# Patient Record
Sex: Male | Born: 1950 | Race: Black or African American | Hispanic: No | Marital: Single | Smoking: Former smoker
Health system: Southern US, Community
[De-identification: ages and names within clinical notes are randomized; demographics above are authoritative.]

## PROBLEM LIST (undated history)

## (undated) DIAGNOSIS — M51369 Other intervertebral disc degeneration, lumbar region without mention of lumbar back pain or lower extremity pain: Secondary | ICD-10-CM

## (undated) DIAGNOSIS — E785 Hyperlipidemia, unspecified: Secondary | ICD-10-CM

## (undated) DIAGNOSIS — K219 Gastro-esophageal reflux disease without esophagitis: Secondary | ICD-10-CM

## (undated) DIAGNOSIS — I10 Essential (primary) hypertension: Secondary | ICD-10-CM

## (undated) DIAGNOSIS — M5136 Other intervertebral disc degeneration, lumbar region: Secondary | ICD-10-CM

## (undated) DIAGNOSIS — H269 Unspecified cataract: Secondary | ICD-10-CM

## (undated) DIAGNOSIS — T7840XA Allergy, unspecified, initial encounter: Secondary | ICD-10-CM

## (undated) DIAGNOSIS — N400 Enlarged prostate without lower urinary tract symptoms: Secondary | ICD-10-CM

## (undated) DIAGNOSIS — I4891 Unspecified atrial fibrillation: Secondary | ICD-10-CM

## (undated) DIAGNOSIS — M5430 Sciatica, unspecified side: Secondary | ICD-10-CM

## (undated) HISTORY — DX: Benign prostatic hyperplasia without lower urinary tract symptoms: N40.0

## (undated) HISTORY — PX: COLONOSCOPY: SHX174

## (undated) HISTORY — PX: KNEE SURGERY: SHX244

## (undated) HISTORY — DX: Hyperlipidemia, unspecified: E78.5

## (undated) HISTORY — DX: Allergy, unspecified, initial encounter: T78.40XA

## (undated) HISTORY — DX: Gastro-esophageal reflux disease without esophagitis: K21.9

## (undated) HISTORY — DX: Unspecified cataract: H26.9

---

## 2004-10-10 ENCOUNTER — Encounter: Admission: RE | Admit: 2004-10-10 | Discharge: 2004-10-10 | Payer: Self-pay | Admitting: Family Medicine

## 2004-12-16 ENCOUNTER — Encounter: Admission: RE | Admit: 2004-12-16 | Discharge: 2004-12-16 | Payer: Self-pay | Admitting: Family Medicine

## 2006-02-04 ENCOUNTER — Emergency Department (HOSPITAL_COMMUNITY): Admission: EM | Admit: 2006-02-04 | Discharge: 2006-02-04 | Payer: Self-pay | Admitting: Emergency Medicine

## 2007-05-16 ENCOUNTER — Emergency Department (HOSPITAL_COMMUNITY): Admission: EM | Admit: 2007-05-16 | Discharge: 2007-05-16 | Payer: Self-pay | Admitting: Emergency Medicine

## 2007-05-17 ENCOUNTER — Ambulatory Visit (HOSPITAL_COMMUNITY): Admission: RE | Admit: 2007-05-17 | Discharge: 2007-05-17 | Payer: Self-pay | Admitting: Emergency Medicine

## 2007-07-12 ENCOUNTER — Ambulatory Visit: Payer: Self-pay | Admitting: Internal Medicine

## 2007-07-15 ENCOUNTER — Ambulatory Visit: Payer: Self-pay | Admitting: *Deleted

## 2007-08-09 ENCOUNTER — Encounter: Payer: Self-pay | Admitting: Family Medicine

## 2007-08-09 ENCOUNTER — Ambulatory Visit: Payer: Self-pay | Admitting: Internal Medicine

## 2007-08-09 LAB — CONVERTED CEMR LAB
ALT: 28 units/L (ref 0–53)
AST: 26 units/L (ref 0–37)
Albumin: 4.6 g/dL (ref 3.5–5.2)
Alkaline Phosphatase: 77 units/L (ref 39–117)
BUN: 15 mg/dL (ref 6–23)
Basophils Absolute: 0.1 10*3/uL (ref 0.0–0.1)
Basophils Relative: 1 % (ref 0–1)
CO2: 24 meq/L (ref 19–32)
Calcium: 10.2 mg/dL (ref 8.4–10.5)
Chloride: 104 meq/L (ref 96–112)
Cholesterol: 209 mg/dL — ABNORMAL HIGH (ref 0–200)
Creatinine, Ser: 0.78 mg/dL (ref 0.40–1.50)
Eosinophils Absolute: 0.8 10*3/uL — ABNORMAL HIGH (ref 0.0–0.7)
Eosinophils Relative: 9 % — ABNORMAL HIGH (ref 0–5)
Glucose, Bld: 99 mg/dL (ref 70–99)
HCT: 43.2 % (ref 39.0–52.0)
HDL: 82 mg/dL (ref >39)
Hemoglobin: 14.4 g/dL (ref 13.0–17.0)
LDL Cholesterol: 115 mg/dL — ABNORMAL HIGH (ref 0–99)
Lymphocytes Relative: 23 % (ref 12–46)
Lymphs Abs: 2.2 10*3/uL (ref 0.7–4.0)
MCHC: 33.3 g/dL (ref 30.0–36.0)
MCV: 93.1 fL (ref 78.0–100.0)
Monocytes Absolute: 0.9 10*3/uL (ref 0.1–1.0)
Monocytes Relative: 9 % (ref 3–12)
Neutro Abs: 5.6 10*3/uL (ref 1.7–7.7)
Neutrophils Relative %: 59 % (ref 43–77)
PSA: 0.98 ng/mL (ref 0.10–4.00)
Platelets: 236 10*3/uL (ref 150–400)
Potassium: 4.2 meq/L (ref 3.5–5.3)
RBC: 4.64 M/uL (ref 4.22–5.81)
RDW: 15.7 % — ABNORMAL HIGH (ref 11.5–15.5)
Sodium: 140 meq/L (ref 135–145)
TSH: 0.766 microintl units/mL (ref 0.350–4.50)
Total Bilirubin: 0.8 mg/dL (ref 0.3–1.2)
Total CHOL/HDL Ratio: 2.5
Total Protein: 7.2 g/dL (ref 6.0–8.3)
Triglycerides: 62 mg/dL (ref <150)
VLDL: 12 mg/dL (ref 0–40)
WBC: 9.6 10*3/uL (ref 4.0–10.5)

## 2007-08-26 ENCOUNTER — Encounter: Payer: Self-pay | Admitting: Family Medicine

## 2007-08-26 ENCOUNTER — Other Ambulatory Visit: Admission: RE | Admit: 2007-08-26 | Discharge: 2007-08-26 | Payer: Self-pay | Admitting: Family Medicine

## 2007-08-26 ENCOUNTER — Ambulatory Visit: Payer: Self-pay | Admitting: Internal Medicine

## 2007-11-29 ENCOUNTER — Ambulatory Visit: Payer: Self-pay | Admitting: Internal Medicine

## 2009-02-03 ENCOUNTER — Emergency Department (HOSPITAL_COMMUNITY): Admission: EM | Admit: 2009-02-03 | Discharge: 2009-02-03 | Payer: Self-pay | Admitting: Emergency Medicine

## 2009-03-23 ENCOUNTER — Emergency Department (HOSPITAL_COMMUNITY): Admission: EM | Admit: 2009-03-23 | Discharge: 2009-03-23 | Payer: Self-pay | Admitting: Emergency Medicine

## 2009-04-04 ENCOUNTER — Emergency Department (HOSPITAL_COMMUNITY): Admission: EM | Admit: 2009-04-04 | Discharge: 2009-04-04 | Payer: Self-pay | Admitting: Emergency Medicine

## 2009-05-04 ENCOUNTER — Ambulatory Visit (HOSPITAL_COMMUNITY): Admission: RE | Admit: 2009-05-04 | Discharge: 2009-05-04 | Payer: Self-pay | Admitting: General Surgery

## 2009-05-04 HISTORY — PX: INGUINAL HERNIA REPAIR: SUR1180

## 2009-09-23 ENCOUNTER — Emergency Department (HOSPITAL_COMMUNITY): Admission: EM | Admit: 2009-09-23 | Discharge: 2009-09-23 | Payer: Self-pay | Admitting: Emergency Medicine

## 2010-04-17 LAB — DIFFERENTIAL
Basophils Absolute: 0 10*3/uL (ref 0.0–0.1)
Basophils Relative: 1 % (ref 0–1)
Eosinophils Absolute: 0.4 10*3/uL (ref 0.0–0.7)
Eosinophils Relative: 6 % — ABNORMAL HIGH (ref 0–5)
Lymphocytes Relative: 27 % (ref 12–46)
Lymphs Abs: 1.9 10*3/uL (ref 0.7–4.0)
Monocytes Absolute: 0.7 10*3/uL (ref 0.1–1.0)
Monocytes Relative: 11 % (ref 3–12)
Neutro Abs: 3.8 10*3/uL (ref 1.7–7.7)
Neutrophils Relative %: 55 % (ref 43–77)

## 2010-04-17 LAB — CBC
HCT: 36.4 % — ABNORMAL LOW (ref 39.0–52.0)
Hemoglobin: 12.1 g/dL — ABNORMAL LOW (ref 13.0–17.0)
MCHC: 33.3 g/dL (ref 30.0–36.0)
MCV: 93.3 fL (ref 78.0–100.0)
Platelets: 267 10*3/uL (ref 150–400)
RBC: 3.9 MIL/uL — ABNORMAL LOW (ref 4.22–5.81)
RDW: 14.7 % (ref 11.5–15.5)
WBC: 6.8 10*3/uL (ref 4.0–10.5)

## 2010-04-17 LAB — BASIC METABOLIC PANEL
BUN: 11 mg/dL (ref 6–23)
CO2: 28 mEq/L (ref 19–32)
Calcium: 9.4 mg/dL (ref 8.4–10.5)
Chloride: 101 mEq/L (ref 96–112)
Creatinine, Ser: 0.83 mg/dL (ref 0.4–1.5)
GFR calc Af Amer: >60 mL/min (ref >60)
GFR calc non Af Amer: >60 mL/min (ref >60)
Glucose, Bld: 93 mg/dL (ref 70–99)
Potassium: 3.8 mEq/L (ref 3.5–5.1)
Sodium: 136 mEq/L (ref 135–145)

## 2011-08-08 ENCOUNTER — Emergency Department (HOSPITAL_COMMUNITY): Payer: Self-pay

## 2011-08-08 ENCOUNTER — Emergency Department (HOSPITAL_COMMUNITY)
Admission: EM | Admit: 2011-08-08 | Discharge: 2011-08-08 | Disposition: A | Discharge status: Home / Self Care | Payer: Self-pay | Attending: Emergency Medicine | Admitting: Emergency Medicine

## 2011-08-08 ENCOUNTER — Encounter (HOSPITAL_COMMUNITY): Payer: Self-pay

## 2011-08-08 DIAGNOSIS — M545 Low back pain, unspecified: Secondary | ICD-10-CM

## 2011-08-08 DIAGNOSIS — M51379 Other intervertebral disc degeneration, lumbosacral region without mention of lumbar back pain or lower extremity pain: Secondary | ICD-10-CM | POA: Insufficient documentation

## 2011-08-08 DIAGNOSIS — M5137 Other intervertebral disc degeneration, lumbosacral region: Secondary | ICD-10-CM | POA: Insufficient documentation

## 2011-08-08 DIAGNOSIS — I10 Essential (primary) hypertension: Secondary | ICD-10-CM | POA: Insufficient documentation

## 2011-08-08 HISTORY — DX: Essential (primary) hypertension: I10

## 2011-08-08 HISTORY — DX: Other intervertebral disc degeneration, lumbar region without mention of lumbar back pain or lower extremity pain: M51.369

## 2011-08-08 HISTORY — DX: Sciatica, unspecified side: M54.30

## 2011-08-08 HISTORY — DX: Other intervertebral disc degeneration, lumbar region: M51.36

## 2011-08-08 MED ORDER — METHOCARBAMOL 500 MG PO TABS: 1000.0000 mg | ORAL_TABLET | Freq: Four times a day (QID) | ORAL | Status: AC | PRN

## 2011-08-08 MED ORDER — HYDROCODONE-ACETAMINOPHEN 5-325 MG PO TABS
1.0000 | ORAL_TABLET | Freq: Once | ORAL | Status: AC
  Administered 2011-08-08: 1 via ORAL
  Filled 2011-08-08: qty 1

## 2011-08-08 MED ORDER — OXYCODONE-ACETAMINOPHEN 5-325 MG PO TABS: ORAL_TABLET | ORAL | Status: DC

## 2011-08-08 NOTE — ED Notes (Addendum)
Patient with no complaints at this time. Respirations even and unlabored. Skin warm/dry. Discharge instructions reviewed with patient at this time. Patient given opportunity to voice concerns/ask questions.Dr. Richrd Prime notified of elevated BP. Told patient to go home and take BP meds immediately.  Patient discharged at this time and left Emergency Department with steady gait.

## 2011-08-08 NOTE — ED Provider Notes (Signed)
History     CSN: [id number]  Arrival date & time 08/08/11  1623   First MD Initiated Contact with Patient 08/08/11 1937      Chief Complaint  Patient presents with  . Hip Pain     HPI Pt was seen at 1940.  Per pt, c/o gradual onset and persistence of constant acute flair of his chronic left sided low back "pain" which radiates into his left hip and upper leg for the past week.  Pt describes his pain as "achey," and per his usual chronic sciatic pain pattern.  Pain worsens with palpation of the area and body position changes. Denies incont/retention of bowel or bladder, no saddle anesthesia, no focal motor weakness, no tingling/numbness in extremities, no fevers, no rash, no injury, no testicular pain/swelling, no dysuria/hematuria, no abd pain.  The symptoms have been associated with no other complaints. The patient has a significant history of similar symptoms previously.     Past Medical History  Diagnosis Date  . Hypertension   . DDD (degenerative disc disease), lumbar   . Sciatica     Past Surgical History  Procedure Date  . Inguinal hernia repair     History  Substance Use Topics  . Smoking status: Never Smoker   . Smokeless tobacco: Not on file  . Alcohol Use:      beer occ    Review of Systems ROS: Statement: All systems negative except as marked or noted in the HPI; Constitutional: Negative for fever and chills. ; ; Eyes: Negative for eye pain, redness and discharge. ; ; ENMT: Negative for ear pain, hoarseness, nasal congestion, sinus pressure and sore throat. ; ; Cardiovascular: Negative for chest pain, palpitations, diaphoresis, dyspnea and peripheral edema. ; ; Respiratory: Negative for cough, wheezing and stridor. ; ; Gastrointestinal: Negative for nausea, vomiting, diarrhea, abdominal pain, blood in stool, hematemesis, jaundice and rectal bleeding. . ; ; Genitourinary: Negative for dysuria, flank pain and hematuria. ; ; Musculoskeletal: +LBP, hip pain. Negative for  neck pain. Negative for swelling and trauma.; ; Skin: Negative for pruritus, rash, abrasions, blisters, bruising and skin lesion.; ; Neuro: Negative for headache, lightheadedness and neck stiffness. Negative for weakness, altered level of consciousness , altered mental status, extremity weakness, paresthesias, involuntary movement, seizure and syncope.     Allergies  Review of patient's allergies indicates no known allergies.  Home Medications   Current Outpatient Rx  Name Route Sig Dispense Refill  . CLONIDINE HCL 0.1 MG PO TABS Oral Take 0.3 mg by mouth daily.      BP 193/105  Pulse 68  Temp 98.3 F (36.8 C) (Oral)  Resp 20  Ht 6\' 3"  (1.905 m)  Wt 200 lb (90.719 kg)  BMI 25.00 kg/m2  SpO2 98%  Physical Exam 1945: Physical examination:  Nursing notes reviewed; Vital signs and O2 SAT reviewed;  Constitutional: Well developed, Well nourished, Well hydrated, In no acute distress; Head:  Normocephalic, atraumatic; Eyes: EOMI, PERRL, No scleral icterus; ENMT: Mouth and pharynx normal, Mucous membranes moist; Neck: Supple, Full range of motion, No lymphadenopathy; Cardiovascular: Regular rate and rhythm, No gallop; Respiratory: Breath sounds clear & equal bilaterally, No rales, rhonchi, wheezes.  Speaking full sentences with ease, Normal respiratory effort/excursion; Chest: Nontender, Movement normal; Abdomen: Soft, Nontender, Nondistended, Normal bowel sounds; Genitourinary: No CVA tenderness; Spine:  No midline CS, TS, LS tenderness. +TTP left lower lumbar paraspinal muscles and SI joint areas.;; Extremities: Pelvis stable. Pulses normal, No tenderness, No edema, No calf edema  or asymmetry.; Neuro: AA&Ox3, Major CN grossly intact.  Speech clear. Strength 5/5 equal bilat UE's and LE's, including great toe dorsiflexion.  DTR 2/4 equal bilat UE's and LE's.  No gross sensory deficits.  Neg straight leg raises bilat.; Skin: Color normal, Warm, Dry.   ED Course  Procedures    MDM   MDM Reviewed: nursing note and vitals Interpretation: x-ray    Dg Lumbar Spine Complete 08/08/2011  *RADIOLOGY REPORT*  Clinical Data: Left-sided low back pain.  LUMBAR SPINE - COMPLETE 4+ VIEW  Comparison: None.  Findings: No evidence of lumbar spine fracture, spondylolysis, or spondylolisthesis.  Mild degenerative disc disease is seen at L2-3 and L3-4.  Moderate degenerative disc disease is seen at L4-5 and L5-S1.  Mild bilateral facet DJD is also seen in the lower lumbar spine from levels of L3-S1.  No other bone lesions identified.  IMPRESSION:  1.  No acute findings. 2.  Lower lumbar degenerative spondylosis.  Original Report Authenticated By: Danae Orleans, M.D.   Dg Hip Complete Left 08/08/2011  *RADIOLOGY REPORT*  Clinical Data: Left hip pain for 1 week, no injury  LEFT HIP - COMPLETE 2+ VIEW  Comparison: None.  Findings: There is mild degenerative joint disease of the hips with some loss of joint space and spurring.  The pelvic rami are intact. The SI joints are corticated.  No acute abnormality is seen.  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  No acute abnormality.  Original Report Authenticated By: Juline Patch, M.D.      8:44 PM:  DJD/DDD on XR.  No concerning findings for cauda equina at this time.  Abd benign with strong pedal pulses, no hx of aortic pathology on CT AP from 2006; doubt AAA/PAD as cause for pain at this time.  Feels better and wants to go home now.  BP is elevated today, has hx of HTN, currently asymptomatic.  Pt states he has his BP meds at home, but "just forgot to take them" today (wife states "it's been a few days").  Both are very sure pt has his BP meds at home and would rather go home and take them (vs being given a dose here).  Both state pt does not need a rx for them.  Dx testing d/w pt and family.  Questions answered.  Verb understanding, agreeable to d/c home with outpt f/u and strongly encouraged to take his BP meds when he gets home tonight  and to take them regularly. Verb understanding and agreeable.         Laray Anger, DO 08/09/11 [phone]

## 2011-08-08 NOTE — ED Notes (Signed)
Pt reports approx one week ago woke up with slight pain in left hip radiating down leg.  Reports pain has gotten worse over the week.  Says has had pain like this before but says doesn't remember what the doctor told him was the cause.

## 2012-06-17 ENCOUNTER — Encounter (HOSPITAL_COMMUNITY): Payer: Self-pay | Admitting: Emergency Medicine

## 2012-06-17 ENCOUNTER — Emergency Department (HOSPITAL_COMMUNITY)
Admission: EM | Admit: 2012-06-17 | Discharge: 2012-06-17 | Disposition: A | Discharge status: Home / Self Care | Payer: BC Managed Care – PPO | Attending: Emergency Medicine | Admitting: Emergency Medicine

## 2012-06-17 DIAGNOSIS — Z79899 Other long term (current) drug therapy: Secondary | ICD-10-CM | POA: Insufficient documentation

## 2012-06-17 DIAGNOSIS — M51379 Other intervertebral disc degeneration, lumbosacral region without mention of lumbar back pain or lower extremity pain: Secondary | ICD-10-CM | POA: Insufficient documentation

## 2012-06-17 DIAGNOSIS — M5137 Other intervertebral disc degeneration, lumbosacral region: Secondary | ICD-10-CM | POA: Insufficient documentation

## 2012-06-17 DIAGNOSIS — L03116 Cellulitis of left lower limb: Secondary | ICD-10-CM

## 2012-06-17 DIAGNOSIS — L02619 Cutaneous abscess of unspecified foot: Secondary | ICD-10-CM | POA: Insufficient documentation

## 2012-06-17 DIAGNOSIS — I10 Essential (primary) hypertension: Secondary | ICD-10-CM | POA: Insufficient documentation

## 2012-06-17 DIAGNOSIS — Z8739 Personal history of other diseases of the musculoskeletal system and connective tissue: Secondary | ICD-10-CM | POA: Insufficient documentation

## 2012-06-17 MED ORDER — CEPHALEXIN 250 MG PO CAPS: 250.0000 mg | ORAL_CAPSULE | Freq: Four times a day (QID) | ORAL | Status: DC

## 2012-06-17 MED ORDER — SULFAMETHOXAZOLE-TRIMETHOPRIM 800-160 MG PO TABS: 1.0000 | ORAL_TABLET | Freq: Two times a day (BID) | ORAL | Status: DC

## 2012-06-17 NOTE — ED Provider Notes (Signed)
History     CSN: [id number]  Arrival date & time 06/17/12  1147   First MD Initiated Contact with Patient 06/17/12 1208      Chief Complaint  Patient presents with  . Insect Bite    (Consider location/radiation/quality/duration/timing/severity/associated sxs/prior treatment) HPI Trevor Rangel is a 62 y.o. male who presents to the ED with redness of his left foot due to  possible insect bite. The area started itching about a week ago and then the past 3 days he noted redness starting. The redness has seem to spread but the swelling has decreased. The pain is moderate.  He denies fever or chills. The history was provided by the patient.   Past Medical History  Diagnosis Date  . Hypertension   . DDD (degenerative disc disease), lumbar   . Sciatica     Past Surgical History  Procedure Laterality Date  . Inguinal hernia repair      No family history on file.  History  Substance Use Topics  . Smoking status: Never Smoker   . Smokeless tobacco: Not on file  . Alcohol Use:      Comment: beer occ      Review of Systems  Constitutional: Negative for fever and chills.  HENT: Negative for neck pain.   Respiratory: Negative for cough and chest tightness.   Gastrointestinal: Negative for nausea, vomiting and abdominal pain.  Musculoskeletal:       Redness and ? Insect bit to left foot.  Skin: Positive for wound.  Neurological: Negative for headaches.  Psychiatric/Behavioral: The patient is not nervous/anxious.     Allergies  Review of patient's allergies indicates no known allergies.  Home Medications   Current Outpatient Rx  Name  Route  Sig  Dispense  Refill  . cloNIDine (CATAPRES) 0.1 MG tablet   Oral   Take 0.3 mg by mouth daily.         Marland Kitchen terazosin (HYTRIN) 5 MG capsule   Oral   Take 5 mg by mouth at bedtime.           BP 143/86  Pulse 83  Temp(Src) 97.5 F (36.4 C) (Oral)  Resp 20  Ht 6\' 3"  (1.905 m)  Wt 205 lb (92.987 kg)  BMI 25.62 kg/m2   SpO2 100%  Physical Exam  Nursing note and vitals reviewed. Constitutional: He is oriented to person, place, and time. He appears well-developed and well-nourished. No distress.  HENT:  Head: Normocephalic.  Eyes: EOM are normal.  Neck: Neck supple.  Cardiovascular: Normal rate.   Pulmonary/Chest: Effort normal.  Musculoskeletal:       Left foot: He exhibits normal range of motion, no tenderness, normal capillary refill, no deformity and no laceration. Swelling: minimal.       Feet:  Left lateral foot with erythema. Pedal pulse strong, adequate circulation, good touch sensation.  Neurological: He is alert and oriented to person, place, and time. He has normal strength. No cranial nerve deficit or sensory deficit.  Skin: Skin is warm and dry.  Psychiatric: He has a normal mood and affect. His behavior is normal. Judgment and thought content normal.    ED Course  Procedures (including critical care time)  MDM  62 y.o. male with cellulitis to the left foot resulting from possible insect bite. I have reviewed this patient's vital signs, nurses notes and discussed clinical findings and plan of care with the patient. Patient voices understanding. No further screening or x-ray indicated at this time. Will  start antibiotics and take Benadryl as needed.  I discussed this case with Dr. Bernette Mayers   Medication List    TAKE these medications       cephALEXin 250 MG capsule  Commonly known as:  KEFLEX  Take 1 capsule (250 mg total) by mouth 4 (four) times daily.     sulfamethoxazole-trimethoprim 800-160 MG per tablet  Commonly known as:  SEPTRA DS  Take 1 tablet by mouth every 12 (twelve) hours.      ASK your doctor about these medications       cloNIDine 0.1 MG tablet  Commonly known as:  CATAPRES  Take 0.3 mg by mouth daily.     terazosin 5 MG capsule  Commonly known as:  HYTRIN  Take 5 mg by mouth at bedtime.               [place], [place] 06/17/12 [phone]

## 2012-06-17 NOTE — ED Notes (Signed)
States that he noticed a red area on left foot about 2-3 days ago.  States the area was itching about 1 week.

## 2012-06-18 NOTE — ED Provider Notes (Signed)
Medical screening examination/treatment/procedure(s) were performed by non-physician practitioner and as supervising physician I was immediately available for consultation/collaboration.   Charles B. Sheldon, MD 06/18/12 1559 

## 2012-12-15 ENCOUNTER — Ambulatory Visit (INDEPENDENT_AMBULATORY_CARE_PROVIDER_SITE_OTHER): Payer: BC Managed Care – PPO | Admitting: Surgery

## 2012-12-15 ENCOUNTER — Telehealth (INDEPENDENT_AMBULATORY_CARE_PROVIDER_SITE_OTHER): Payer: Self-pay | Admitting: Surgery

## 2012-12-15 ENCOUNTER — Encounter (INDEPENDENT_AMBULATORY_CARE_PROVIDER_SITE_OTHER): Payer: Self-pay | Admitting: Surgery

## 2012-12-15 VITALS — BP 162/78 | HR 76 | Resp 16 | Ht 75.0 in | Wt 202.6 lb

## 2012-12-15 DIAGNOSIS — K4020 Bilateral inguinal hernia, without obstruction or gangrene, not specified as recurrent: Secondary | ICD-10-CM | POA: Insufficient documentation

## 2012-12-15 DIAGNOSIS — K4090 Unilateral inguinal hernia, without obstruction or gangrene, not specified as recurrent: Secondary | ICD-10-CM

## 2012-12-15 DIAGNOSIS — N400 Enlarged prostate without lower urinary tract symptoms: Secondary | ICD-10-CM

## 2012-12-15 NOTE — Progress Notes (Addendum)
Subjective:     Patient ID: Trevor Rangel, male   DOB: 09-Feb-1950, 62 y.o.   MRN: [id number]  HPI  Trevor Rangel  06-04-1950 [id number]  Patient Care Team: Reggie Pile. Dareen Piano as PCP - General (Nurse Practitioner) Reggie Pile. Dareen Piano as Publishing rights manager (Nurse Practitioner)  This patient is a 63 y.o.male who presents today for surgical evaluation at the request of Dr. Dayton Scrape, NP.   Reason for visit: Right inguinal hernia  Was an active male.  History of left inguinal hernia requiring open repair by Dr. Zachery Dakins 2011.  Patient noted some groin swelling on the opposite, right side.  It is gradually increased.  It is more sensitive.  He does struggle with some mild urinary BPH issues but that is stable on the Hytrin.  Normally has a daily bowel movement.  He walks several miles a day without difficulty.  Has a bowel movement every day.  No history of skin infections or MRSA  Patient Active Problem List   Diagnosis Date Noted  . Inguinal hernia, right 12/15/2012  . BPH (benign prostatic hyperplasia)     Past Medical History  Diagnosis Date  . Hypertension   . DDD (degenerative disc disease), lumbar   . Sciatica   . BPH (benign prostatic hyperplasia)     Past Surgical History  Procedure Laterality Date  . Inguinal hernia repair Left 05/04/2009    Dr Zachery Dakins    History   Social History  . Marital Status: Single    Spouse Name: N/A    Number of Children: N/A  . Years of Education: N/A   Occupational History  . Not on file.   Social History Main Topics  . Smoking status: Former Games developer  . Smokeless tobacco: Never Used  . Alcohol Use: Yes     Comment: beer occ  . Drug Use: No  . Sexual Activity: Not on file   Other Topics Concern  . Not on file   Social History Narrative  . No narrative on file    Family History  Problem Relation Age of Onset  . Heart disease Mother   . Cancer Father     prostate    Current Outpatient Prescriptions  Medication Sig  Dispense Refill  . thiamine 50 MG tablet Take 50 mg by mouth daily.      . cloNIDine (CATAPRES) 0.1 MG tablet Take 0.3 mg by mouth daily.       No current facility-administered medications for this visit.     No Known Allergies  BP 162/78  Pulse 76  Resp 16  Ht 6\' 3"  (1.905 m)  Wt 202 lb 9.6 oz (91.899 kg)  BMI 25.32 kg/m2  No results found.   Review of Systems  Constitutional: Negative for fever, chills and diaphoresis.  HENT: Negative for ear discharge, facial swelling, mouth sores, nosebleeds, sore throat and trouble swallowing.   Eyes: Negative for photophobia, discharge and visual disturbance.  Respiratory: Negative for choking, chest tightness, shortness of breath and stridor.   Cardiovascular: Negative for chest pain and palpitations.  Gastrointestinal: Negative for nausea, vomiting, abdominal pain, diarrhea, constipation, blood in stool, abdominal distention, anal bleeding and rectal pain.  Endocrine: Negative for cold intolerance and heat intolerance.  Genitourinary: Positive for difficulty urinating. Negative for dysuria, urgency, frequency, flank pain, discharge, penile swelling, scrotal swelling, enuresis, penile pain and testicular pain.       Mild difficulty starting urinary stream.  No nighttime urination.  Musculoskeletal: Negative for arthralgias, back  pain, gait problem and myalgias.  Skin: Negative for color change, pallor, rash and wound.  Allergic/Immunologic: Negative for environmental allergies and food allergies.  Neurological: Negative for dizziness, speech difficulty, weakness, numbness and headaches.  Hematological: Negative for adenopathy. Does not bruise/bleed easily.  Psychiatric/Behavioral: Negative for hallucinations, confusion and agitation.       Objective:   Physical Exam  Constitutional: He is oriented to person, place, and time. He appears well-developed and well-nourished. No distress.  HENT:  Head: Normocephalic.  Mouth/Throat:  Oropharynx is clear and moist. No oropharyngeal exudate.  Eyes: Conjunctivae and EOM are normal. Pupils are equal, round, and reactive to light. No scleral icterus.  Neck: Normal range of motion. Neck supple. No tracheal deviation present.  Cardiovascular: Normal rate, regular rhythm and intact distal pulses.   Pulmonary/Chest: Effort normal and breath sounds normal. No respiratory distress.  Abdominal: Soft. He exhibits no distension. There is no tenderness. A hernia is present. Hernia confirmed positive in the right inguinal area. Hernia confirmed negative in the ventral area.    Musculoskeletal: Normal range of motion. He exhibits no tenderness.  Lymphadenopathy:    He has no cervical adenopathy.       Right: No inguinal adenopathy present.       Left: No inguinal adenopathy present.  Neurological: He is alert and oriented to person, place, and time. No cranial nerve deficit. He exhibits normal muscle tone. Coordination normal.  Skin: Skin is warm and dry. No rash noted. He is not diaphoretic. No erythema. No pallor.  Psychiatric: He has a normal mood and affect. His behavior is normal. Judgment and thought content normal.       Assessment:     Obvious right inguinal hernia.  Possible small recurrent left inguinal hernia although unlikely.      Plan:     I recommended surgical repair of the right groin.  He wished me to check the left side as well.  He would be a good laparoscopic candidate.  He is hoping to get back to work within a week.  Cautioned him that may be too soon and then 2 weeks is more realistic but we will see.  I discussed with him:   The anatomy & physiology of the abdominal wall and pelvic floor was discussed.  The pathophysiology of hernias in the inguinal and pelvic region was discussed.  Natural history risks such as progressive enlargement, pain, incarceration & strangulation was discussed.   Contributors to complications such as smoking, obesity, diabetes,  prior surgery, etc were discussed.    I feel the risks of no intervention will lead to serious problems that outweigh the operative risks; therefore, I recommended surgery to reduce and repair the hernia.  I explained laparoscopic techniques with possible need for an open approach.  I noted usual use of mesh to patch and/or buttress hernia repair  Risks such as bleeding, infection, abscess, need for further treatment, heart attack, death, and other risks were discussed.  I noted a good likelihood this will help address the problem.   Goals of post-operative recovery were discussed as well.  Possibility that this will not correct all symptoms was explained.  I stressed the importance of low-impact activity, aggressive pain control, avoiding constipation, & not pushing through pain to minimize risk of post-operative chronic pain or injury. Possibility of reherniation was discussed.  We will work to minimize complications.     An educational handout further explaining the pathology & treatment options was given as well.  Questions were answered.  The patient expresses understanding & wishes to proceed with surgery.  Consider followup colonoscopy since it is most likely been 10 years.  I defer to his primary care physician.

## 2012-12-15 NOTE — Telephone Encounter (Signed)
I met with patient and went over his benefits and financial responsibility. He will call me back when he is ready to schedule his surgery. skm

## 2012-12-15 NOTE — Patient Instructions (Signed)
See the Handout(s) we gave you.  Consider surgery.  Please call our office at [phone] if you wish to schedule surgery or if you have further questions / concerns.   Hernia A hernia occurs when an internal organ pushes out through a weak spot in the abdominal wall. Hernias most commonly occur in the groin and around the navel. Hernias often can be pushed back into place (reduced). Most hernias tend to get worse over time. Some abdominal hernias can get stuck in the opening (irreducible or incarcerated hernia) and cannot be reduced. An irreducible abdominal hernia which is tightly squeezed into the opening is at risk for impaired blood supply (strangulated hernia). A strangulated hernia is a medical emergency. Because of the risk for an irreducible or strangulated hernia, surgery may be recommended to repair a hernia. CAUSES   Heavy lifting.  Prolonged coughing.  Straining to have a bowel movement.  A cut (incision) made during an abdominal surgery. HOME CARE INSTRUCTIONS   Bed rest is not required. You may continue your normal activities.  Avoid lifting more than 10 pounds (4.5 kg) or straining.  Cough gently. If you are a smoker it is best to stop. Even the best hernia repair can break down with the continual strain of coughing. Even if you do not have your hernia repaired, a cough will continue to aggravate the problem.  Do not wear anything tight over your hernia. Do not try to keep it in with an outside bandage or truss. These can damage abdominal contents if they are trapped within the hernia sac.  Eat a normal diet.  Avoid constipation. Straining over long periods of time will increase hernia size and encourage breakdown of repairs. If you cannot do this with diet alone, stool softeners may be used. SEEK IMMEDIATE MEDICAL CARE IF:   You have a fever.  You develop increasing abdominal pain.  You feel nauseous or vomit.  Your hernia is stuck outside the abdomen, looks  discolored, feels hard, or is tender.  You have any changes in your bowel habits or in the hernia that are unusual for you.  You have increased pain or swelling around the hernia.  You cannot push the hernia back in place by applying gentle pressure while lying down. MAKE SURE YOU:   Understand these instructions.  Will watch your condition.  Will get help right away if you are not doing well or get worse. Document Released: 01/13/2005 Document Revised: 04/07/2011 Document Reviewed: 09/02/2007 [facility] Patient Information 2014 [place].  HERNIA REPAIR: POST OP INSTRUCTIONS  1. DIET: Follow a light bland diet the first 24 hours after arrival home, such as soup, liquids, crackers, etc.  Be sure to include lots of fluids daily.  Avoid fast food or heavy meals as your are more likely to get nauseated.  Eat a low fat the next few days after surgery. 2. Take your usually prescribed home medications unless otherwise directed. 3. PAIN CONTROL: a. Pain is best controlled by a usual combination of three different methods TOGETHER: i. Ice/Heat ii. Over the counter pain medication iii. Prescription pain medication b. Most patients will experience some swelling and bruising around the hernia(s) such as the bellybutton, groins, or old incisions.  Ice packs or heating pads (30-60 minutes up to 6 times a day) will help. Use ice for the first few days to help decrease swelling and bruising, then switch to heat to help relax tight/sore spots and speed recovery.  Some people prefer to  use ice alone, heat alone, alternating between ice & heat.  Experiment to what works for you.  Swelling and bruising can take several weeks to resolve.   c. It is helpful to take an over-the-counter pain medication regularly for the first few weeks.  Choose one of the following that works best for you: i. Naproxen (Aleve, etc)  Two 220mg tabs twice a day ii. Ibuprofen (Advil, etc) Three 200mg tabs four times a day  (every meal & bedtime) iii. Acetaminophen (Tylenol, etc) 325-650mg four times a day (every meal & bedtime) d. A  prescription for pain medication should be given to you upon discharge.  Take your pain medication as prescribed.  i. If you are having problems/concerns with the prescription medicine (does not control pain, nausea, vomiting, rash, itching, etc), please call us [phone] to see if we need to switch you to a different pain medicine that will work better for you and/or control your side effect better. ii. If you need a refill on your pain medication, please contact your pharmacy.  They will contact our office to request authorization. Prescriptions will not be filled after 5 pm or on week-ends. 4. Avoid getting constipated.  Between the surgery and the pain medications, it is common to experience some constipation.  Increasing fluid intake and taking a fiber supplement (such as Metamucil, Citrucel, FiberCon, MiraLax, etc) 1-2 times a day regularly will usually help prevent this problem from occurring.  A mild laxative (prune juice, Milk of Magnesia, MiraLax, etc) should be taken according to package directions if there are no bowel movements after 48 hours.   5. Wash / shower every day.  You may shower over the dressings as they are waterproof.   6. Remove your waterproof bandages 5 days after surgery.  You may leave the incision open to air.  You may replace a dressing/Band-Aid to cover the incision for comfort if you wish.  Continue to shower over incision(s) after the dressing is off.    7. ACTIVITIES as tolerated:   a. You may resume regular (light) daily activities beginning the next day-such as daily self-care, walking, climbing stairs-gradually increasing activities as tolerated.  If you can walk 30 minutes without difficulty, it is safe to try more intense activity such as jogging, treadmill, bicycling, low-impact aerobics, swimming, etc. b. Save the most intensive and strenuous  activity for last such as sit-ups, heavy lifting, contact sports, etc  Refrain from any heavy lifting or straining until you are off narcotics for pain control.   c. DO NOT PUSH THROUGH PAIN.  Let pain be your guide: If it hurts to do something, don't do it.  Pain is your body warning you to avoid that activity for another week until the pain goes down. d. You may drive when you are no longer taking prescription pain medication, you can comfortably wear a seatbelt, and you can safely maneuver your car and apply brakes. e. You may have sexual intercourse when it is comfortable.  8. FOLLOW UP in our office a. Please call [facility] at [phone] to set up an appointment to see your surgeon in the office for a follow-up appointment approximately 2-3 weeks after your surgery. b. Make sure that you call for this appointment the day you arrive home to insure a convenient appointment time. 9.  IF YOU HAVE DISABILITY OR FAMILY LEAVE FORMS, BRING THEM TO THE OFFICE FOR PROCESSING.  DO NOT GIVE THEM TO YOUR DOCTOR.  WHEN TO CALL US (  [phone]: 1. Poor pain control 2. Reactions / problems with new medications (rash/itching, nausea, etc)  3. Fever over 101.5 F (38.5 C) 4. Inability to urinate 5. Nausea and/or vomiting 6. Worsening swelling or bruising 7. Continued bleeding from incision. 8. Increased pain, redness, or drainage from the incision   The clinic staff is available to answer your questions during regular business hours (8:30am-5pm).  Please don't hesitate to call and ask to speak to one of our nurses for clinical concerns.   If you have a medical emergency, go to the nearest emergency room or call 911.  A surgeon from [facility] is always on call at the hospitals in [facility], [place] [address], [place], [place]  [address] ?  [address] [address], [place], [place]   [address] MAIN: [phone] ? TOLL FREE: [phone] ? FAX: [phone]  [phone] [email or web address]  Benign Prostatic Hyperplasia An enlarged prostate (benign prostatic hyperplasia) is common in older men. You may experience the following:  Weak urine stream.  Dribbling.  Feeling like the bladder has not emptied completely.  Difficulty starting urination.  Getting up frequently at night to urinate.  Urinating more frequently during the day. HOME CARE INSTRUCTIONS  Monitor your prostatic hyperplasia for any changes. The following actions may help to alleviate any discomfort you are experiencing:  Give yourself time when you urinate.  Stay away from alcohol.  Avoid beverages containing caffeine, such as coffee, tea, and colas, because they can make the problem worse.  Avoid decongestants, antihistamines, and some prescription medicines that can make the problem worse.  Follow up with your health care provider for further treatment as recommended. SEEK MEDICAL CARE IF:  You are experiencing progressive difficulty voiding.  Your urine stream is progressively getting narrower.  You are awaking from sleep with the urge to void more frequently.  You are constantly feeling the need to void.  You experience loss of urine, especially in small amounts. SEEK IMMEDIATE MEDICAL CARE IF:   You develop increased pain with urination or are unable to urinate.  You develop severe abdominal pain, vomiting, a high fever, or fainting.  You develop back pain or blood in your urine. MAKE SURE YOU:   Understand these instructions.  Will watch your condition.  Will get help right away if you are not doing well or get worse. Document Released: 01/13/2005 Document Revised: 09/15/2012 Document Reviewed: 06/15/2012 [facility] Patient Information 2014 [place], [place].

## 2013-03-10 ENCOUNTER — Encounter (HOSPITAL_COMMUNITY): Payer: Self-pay | Admitting: Pharmacy Technician

## 2013-03-10 ENCOUNTER — Encounter (HOSPITAL_COMMUNITY)
Admission: RE | Admit: 2013-03-10 | Discharge: 2013-03-10 | Disposition: A | Discharge status: Home / Self Care | Payer: BC Managed Care – PPO | Source: Ambulatory Visit | Attending: Surgery | Admitting: Surgery

## 2013-03-10 ENCOUNTER — Ambulatory Visit (HOSPITAL_COMMUNITY)
Admission: RE | Admit: 2013-03-10 | Discharge: 2013-03-10 | Disposition: A | Discharge status: Home / Self Care | Payer: BC Managed Care – PPO | Source: Ambulatory Visit | Attending: Surgery | Admitting: Surgery

## 2013-03-10 ENCOUNTER — Encounter (HOSPITAL_COMMUNITY): Payer: Self-pay

## 2013-03-10 DIAGNOSIS — K469 Unspecified abdominal hernia without obstruction or gangrene: Secondary | ICD-10-CM | POA: Insufficient documentation

## 2013-03-10 DIAGNOSIS — I10 Essential (primary) hypertension: Secondary | ICD-10-CM | POA: Insufficient documentation

## 2013-03-10 DIAGNOSIS — R9431 Abnormal electrocardiogram [ECG] [EKG]: Secondary | ICD-10-CM | POA: Insufficient documentation

## 2013-03-10 DIAGNOSIS — Z01812 Encounter for preprocedural laboratory examination: Secondary | ICD-10-CM | POA: Insufficient documentation

## 2013-03-10 DIAGNOSIS — Z01818 Encounter for other preprocedural examination: Secondary | ICD-10-CM | POA: Insufficient documentation

## 2013-03-10 DIAGNOSIS — Z01810 Encounter for preprocedural cardiovascular examination: Secondary | ICD-10-CM | POA: Insufficient documentation

## 2013-03-10 LAB — BASIC METABOLIC PANEL
BUN: 20 mg/dL (ref 6–23)
CHLORIDE: 98 meq/L (ref 96–112)
CO2: 25 mEq/L (ref 19–32)
Calcium: 10.3 mg/dL (ref 8.4–10.5)
Creatinine, Ser: 0.86 mg/dL (ref 0.50–1.35)
GFR calc Af Amer: >90 mL/min (ref >90)
GFR calc non Af Amer: >90 mL/min (ref >90)
Glucose, Bld: 104 mg/dL — ABNORMAL HIGH (ref 70–99)
Potassium: 4 mEq/L (ref 3.7–5.3)
Sodium: 138 mEq/L (ref 137–147)

## 2013-03-10 LAB — CBC
HCT: 40.9 % (ref 39.0–52.0)
Hemoglobin: 13.9 g/dL (ref 13.0–17.0)
MCH: 30 pg (ref 26.0–34.0)
MCHC: 34 g/dL (ref 30.0–36.0)
MCV: 88.3 fL (ref 78.0–100.0)
PLATELETS: 258 10*3/uL (ref 150–400)
RBC: 4.63 MIL/uL (ref 4.22–5.81)
RDW: 14.7 % (ref 11.5–15.5)
WBC: 6 10*3/uL (ref 4.0–10.5)

## 2013-03-10 NOTE — Patient Instructions (Addendum)
20 Trevor Rangel  03/10/2013   Your procedure is scheduled on:  2-19 -2015  Report to [facility] at      [place]  AM .  Call this number if you have problems the morning of surgery: [phone]  Or Presurgical Testing [phone](Emmaus Brandi) For Living Will and/or Health Care Power Attorney Forms: please provide copy for your medical record,may bring AM of surgery(Forms should be already notarized -we do not provide this service).  Remember: Follow any bowel prep instructions per MD office. For Cpap use: Bring mask and tubing only.   Do not eat food:After Midnight.    Take these medicines the morning of surgery with A SIP OF WATER: none.    Do not wear jewelry, make-up or nail polish.  Do not wear lotions, powders, or perfumes. You may wear deodorant.  Do not shave 48 hours(2 days) prior to first CHG shower(legs and under arms).(Shaving face and neck okay.)  Do not bring valuables to the hospital.(Hospital is not responsible for lost valuables).  Contacts, dentures or removable bridgework, body piercing, hair pins may not be worn into surgery.  Leave suitcase in the car. After surgery it may be brought to your room.  For patients admitted to the hospital, checkout time is 11:00 AM the day of discharge.(Restricted visitors-Any Persons displaying flu-like symptoms or illness).    Patients discharged the day of surgery will not be allowed to drive home. Must have responsible person with you x 24 hours once discharged.  Name and phone number of your driver: Kerry Dory -girlfriend [phone]  Special Instructions: CHG(Chlorhedine 4%-"Hibiclens","Betasept","Aplicare") Shower Use Special Wash: see special instructions.(avoid face and genitals)   Please read over the following fact sheets that you were given: MRSA Information, Blood Transfusion fact sheet, Incentive Spirometry Instruction.    Failure to follow these instructions may result in Cancellation of your  surgery.   Patient signature_______________________________________________________

## 2013-03-10 NOTE — Pre-Procedure Instructions (Signed)
03-10-13 EKG/ CXR done today.

## 2013-03-13 ENCOUNTER — Emergency Department (HOSPITAL_COMMUNITY)
Admission: EM | Admit: 2013-03-13 | Discharge: 2013-03-13 | Disposition: A | Discharge status: Home / Self Care | Payer: BC Managed Care – PPO | Attending: Emergency Medicine | Admitting: Emergency Medicine

## 2013-03-13 ENCOUNTER — Encounter (HOSPITAL_COMMUNITY): Payer: Self-pay | Admitting: Emergency Medicine

## 2013-03-13 DIAGNOSIS — Z87448 Personal history of other diseases of urinary system: Secondary | ICD-10-CM | POA: Insufficient documentation

## 2013-03-13 DIAGNOSIS — Z79899 Other long term (current) drug therapy: Secondary | ICD-10-CM | POA: Insufficient documentation

## 2013-03-13 DIAGNOSIS — I10 Essential (primary) hypertension: Secondary | ICD-10-CM | POA: Insufficient documentation

## 2013-03-13 DIAGNOSIS — Z87891 Personal history of nicotine dependence: Secondary | ICD-10-CM | POA: Insufficient documentation

## 2013-03-13 DIAGNOSIS — Z9889 Other specified postprocedural states: Secondary | ICD-10-CM | POA: Insufficient documentation

## 2013-03-13 DIAGNOSIS — IMO0002 Reserved for concepts with insufficient information to code with codable children: Secondary | ICD-10-CM | POA: Insufficient documentation

## 2013-03-13 DIAGNOSIS — K4090 Unilateral inguinal hernia, without obstruction or gangrene, not specified as recurrent: Secondary | ICD-10-CM

## 2013-03-13 LAB — COMPREHENSIVE METABOLIC PANEL
ALBUMIN: 4.1 g/dL (ref 3.5–5.2)
ALT: 16 U/L (ref 0–53)
AST: 27 U/L (ref 0–37)
Alkaline Phosphatase: 79 U/L (ref 39–117)
BILIRUBIN TOTAL: 0.3 mg/dL (ref 0.3–1.2)
BUN: 16 mg/dL (ref 6–23)
CALCIUM: 9.3 mg/dL (ref 8.4–10.5)
CO2: 26 mEq/L (ref 19–32)
Chloride: 101 mEq/L (ref 96–112)
Creatinine, Ser: 0.81 mg/dL (ref 0.50–1.35)
GFR calc Af Amer: >90 mL/min (ref >90)
GFR calc non Af Amer: >90 mL/min (ref >90)
Glucose, Bld: 103 mg/dL — ABNORMAL HIGH (ref 70–99)
Potassium: 3.8 mEq/L (ref 3.7–5.3)
Sodium: 139 mEq/L (ref 137–147)
Total Protein: 7.8 g/dL (ref 6.0–8.3)

## 2013-03-13 LAB — CBC WITH DIFFERENTIAL/PLATELET
BASOS ABS: 0 10*3/uL (ref 0.0–0.1)
BASOS PCT: 1 % (ref 0–1)
EOS ABS: 0.3 10*3/uL (ref 0.0–0.7)
EOS PCT: 5 % (ref 0–5)
HEMATOCRIT: 39.9 % (ref 39.0–52.0)
HEMOGLOBIN: 13.6 g/dL (ref 13.0–17.0)
Lymphocytes Relative: 53 % — ABNORMAL HIGH (ref 12–46)
Lymphs Abs: 2.7 10*3/uL (ref 0.7–4.0)
MCH: 30.4 pg (ref 26.0–34.0)
MCHC: 34.1 g/dL (ref 30.0–36.0)
MCV: 89.3 fL (ref 78.0–100.0)
MONO ABS: 0.4 10*3/uL (ref 0.1–1.0)
MONOS PCT: 7 % (ref 3–12)
Neutro Abs: 1.7 10*3/uL (ref 1.7–7.7)
Neutrophils Relative %: 33 % — ABNORMAL LOW (ref 43–77)
Platelets: 261 10*3/uL (ref 150–400)
RBC: 4.47 MIL/uL (ref 4.22–5.81)
RDW: 14.7 % (ref 11.5–15.5)
WBC: 5 10*3/uL (ref 4.0–10.5)

## 2013-03-13 LAB — LACTIC ACID, PLASMA: LACTIC ACID, VENOUS: 1.4 mmol/L (ref 0.5–2.2)

## 2013-03-13 MED ORDER — LORAZEPAM 2 MG/ML IJ SOLN
0.5000 mg | Freq: Once | INTRAMUSCULAR | Status: AC
  Administered 2013-03-13: 11:00:00 via INTRAVENOUS
  Filled 2013-03-13 (×2): qty 1

## 2013-03-13 MED ORDER — MORPHINE SULFATE 4 MG/ML IJ SOLN
8.0000 mg | Freq: Once | INTRAMUSCULAR | Status: AC
  Administered 2013-03-13: 8 mg via INTRAVENOUS
  Filled 2013-03-13: qty 2

## 2013-03-13 MED ORDER — ONDANSETRON HCL 4 MG/2ML IJ SOLN
4.0000 mg | Freq: Once | INTRAMUSCULAR | Status: AC
  Administered 2013-03-13: 4 mg via INTRAMUSCULAR
  Filled 2013-03-13: qty 2

## 2013-03-13 MED ORDER — HYDROCODONE-ACETAMINOPHEN 5-325 MG PO TABS: 1.0000 | ORAL_TABLET | ORAL | Status: DC | PRN

## 2013-03-13 MED ORDER — MORPHINE SULFATE 4 MG/ML IJ SOLN: 4.0000 mg | Freq: Once | INTRAMUSCULAR | Status: DC

## 2013-03-13 NOTE — ED Notes (Signed)
Pt has hernia to right abd, has scheduled surgeryThursday. States has never had pain until this am. Grimacing noted. Pt grunts with any movement. Denies changes in gu/bm's. Mm moist. nad at this time.

## 2013-03-13 NOTE — ED Notes (Signed)
Last bm yesterday and was normal per pt

## 2013-03-13 NOTE — ED Notes (Signed)
Dr Adline Mango at bedside,

## 2013-03-13 NOTE — Discharge Instructions (Signed)
Inguinal Hernia, Adult Muscles help keep everything in the body in its proper place. But if a weak spot in the muscles develops, something can poke through. That is called a hernia. When this happens in the lower part of the belly (abdomen), it is called an inguinal hernia. (It takes its name from a part of the body in this region called the inguinal canal.) A weak spot in the wall of muscles lets some fat or part of the small intestine bulge through. An inguinal hernia can develop at any age. Men get them more often than women. CAUSES  In adults, an inguinal hernia develops over time.  It can be triggered by:  Suddenly straining the muscles of the lower abdomen.  Lifting heavy objects.  Straining to have a bowel movement. Difficult bowel movements (constipation) can lead to this.  Constant coughing. This may be caused by smoking or lung disease.  Being overweight.  Being pregnant.  Working at a job that requires long periods of standing or heavy lifting.  Having had an inguinal hernia before. One type can be an emergency situation. It is called a strangulated inguinal hernia. It develops if part of the small intestine slips through the weak spot and cannot get back into the abdomen. The blood supply can be cut off. If that happens, part of the intestine may die. This situation requires emergency surgery. SYMPTOMS  Often, a small inguinal hernia has no symptoms. It is found when a healthcare provider does a physical exam. Larger hernias usually have symptoms.   In adults, symptoms may include:  A lump in the groin. This is easier to see when the person is standing. It might disappear when lying down.  In men, a lump in the scrotum.  Pain or burning in the groin. This occurs especially when lifting, straining or coughing.  A dull ache or feeling of pressure in the groin.  Signs of a strangulated hernia can include:  A bulge in the groin that becomes very painful and tender to the  touch.  A bulge that turns red or purple.  Fever, nausea and vomiting.  Inability to have a bowel movement or to pass gas. DIAGNOSIS  To decide if you have an inguinal hernia, a healthcare provider will probably do a physical examination.  This will include asking questions about any symptoms you have noticed.  The healthcare provider might feel the groin area and ask you to cough. If an inguinal hernia is felt, the healthcare provider may try to slide it back into the abdomen.  Usually no other tests are needed. TREATMENT  Treatments can vary. The size of the hernia makes a difference. Options include:  Watchful waiting. This is often suggested if the hernia is small and you have had no symptoms.  No medical procedure will be done unless symptoms develop.  You will need to watch closely for symptoms. If any occur, contact your healthcare provider right away.  Surgery. This is used if the hernia is larger or you have symptoms.  Open surgery. This is usually an outpatient procedure (you will not stay overnight in a hospital). An cut (incision) is made through the skin in the groin. The hernia is put back inside the abdomen. The weak area in the muscles is then repaired by herniorrhaphy or hernioplasty. Herniorrhaphy: in this type of surgery, the weak muscles are sewn back together. Hernioplasty: a patch or mesh is used to close the weak area in the abdominal wall.  Laparoscopy.  In this procedure, a surgeon makes small incisions. A thin tube with a tiny video camera (called a laparoscope) is put into the abdomen. The surgeon repairs the hernia with mesh by looking with the video camera and using two long instruments. HOME CARE INSTRUCTIONS   After surgery to repair an inguinal hernia:  You will need to take pain medicine prescribed by your healthcare provider. Follow all directions carefully.  You will need to take care of the wound from the incision.  Your activity will be  restricted for awhile. This will probably include no heavy lifting for several weeks. You also should not do anything too active for a few weeks. When you can return to work will depend on the type of job that you have.  During "watchful waiting" periods, you should:  Maintain a healthy weight.  Eat a diet high in fiber (fruits, vegetables and whole grains).  Drink plenty of fluids to avoid constipation. This means drinking enough water and other liquids to keep your urine clear or pale yellow.  Do not lift heavy objects.  Do not stand for long periods of time.  Quit smoking. This should keep you from developing a frequent cough. SEEK MEDICAL CARE IF:   A bulge develops in your groin area.  You feel pain, a burning sensation or pressure in the groin. This might be worse if you are lifting or straining.  You develop a fever of more than 100.5 F (38.1 C). SEEK IMMEDIATE MEDICAL CARE IF:   Pain in the groin increases suddenly.  A bulge in the groin gets bigger suddenly and does not go down.  For men, there is sudden pain in the scrotum. Or, the size of the scrotum increases.  A bulge in the groin area becomes red or purple and is painful to touch.  You have nausea or vomiting that does not go away.  You feel your heart beating much faster than normal.  You cannot have a bowel movement or pass gas.  You develop a fever of more than 102.0 F (38.9 C). Document Released: 06/01/2008 Document Revised: 04/07/2011 Document Reviewed: 06/01/2008 [facility] Patient Information 2014 [place], [place].   You may use the medicine prescribed if needed for pain.  However,  If you develop a recurrence of your swelling and/or pain that does not improve with gentle pressure, you need to get rechecked as discussed here today.  I suggest going to [facility] for recheck as needed so Dr Johney Maine can take care of you. You are welcome however to return here if you are unable to get to [facility].

## 2013-03-13 NOTE — Consult Note (Signed)
Reason for Consult: Incarcerated right inguinal hernia Referring Physician: ER  Breven L Schubert is an 63 y.o. male.  HPI: Patient is a 63 year old black male with a long-standing history of a right inguinal hernia who is scheduled to undergo a right inguinal herniorrhaphy by Dr. Johney [place] in [place] in 4 days. This morning he had sudden onset of right groin pain and swelling. Usually, the swelling resolved with lying down. He presented emergency room. He was found to have an incarcerated right inguinal hernia.  Past Medical History  Diagnosis Date  . Hypertension   . DDD (degenerative disc disease), lumbar   . Sciatica   . BPH (benign prostatic hyperplasia)     Past Surgical History  Procedure Laterality Date  . Inguinal hernia repair Left 05/04/2009    Dr Rise Patience  . Colonoscopy  ~2004    "Normal" per patient    Family History  Problem Relation Age of Onset  . Heart disease Mother   . Cancer Father     prostate    Social History:  reports that he quit smoking about 3 years ago. His smoking use included Cigarettes. He smoked 0.00 packs per day. He has never used smokeless tobacco. He reports that he drinks alcohol. He reports that he does not use illicit drugs.  Allergies: No Known Allergies  Medications: I have reviewed the patient's current medications.  No results found for this or any previous visit (from the past 48 hour(s)).  No results found.  ROS: See chart Blood pressure 167/101, pulse 88, temperature 98.3 F (36.8 C), temperature source Oral, resp. rate 21, height 6\' 3"  (1.905 m), weight 95.709 kg (211 lb), SpO2 95.00%. Physical Exam: Pleasant black male who is complaining of pain in the right groin region. Abdomen: Soft, nondistended. A large right inguinal hernia is noted with bowel extending into the scrotum. Patient was premedicated and the hernia was reduced at bedside. Patient states relief of the pain.  Assessment/Plan: Impression: Incarcerated right inguinal  hernia, resolved Plan: Patient instructed on how to reduce the hernia on his own. No need for acute surgical intervention at this time. He will followup with Dr. Johney [place] to have his hernia fixed.  Rhylie Stehr A 03/13/2013, 11:07 AM

## 2013-03-13 NOTE — ED Provider Notes (Signed)
CSN: [id number]     Arrival date & time 03/13/13  1006 History  This chart was scribed for non-physician practitioner Evalee Jefferson PA-C working with Maudry Diego, MD by Ludger Nutting, ED Scribe. This patient was seen in room APA07/APA07 and the patient's care was started at 10:30 AM.    Chief Complaint  Patient presents with  . Abdominal Pain  . Hernia      The history is provided by the patient. No language interpreter was used.    HPI Comments: Trevor Rangel is a 63 y.o. male who presents to the Emergency Department complaining of sudden onset, constant, unchanged pain in his right groin at the site of a known inguinal hernia.  Symptoms began this morning.  He has had this hernia for months,  Has never caused pain, just swelling until this am. He also reports associated nausea and abdominal bloating. He also has a scheduled surgery in 4 days to repair a right abdominal hernia. Pt states he has not experienced any pain associated with this hernia prior to this morning. He denies vomiting, known fevers. He reports having a left sided hernia repaired in 2011. Pain is constant, worse with movement, but severe even at rest.  There is no radiation of pain and he has found no alleviators.  Surgeon for upcoming surgery is Dr. Johney Maine at [facility]   Past Medical History  Diagnosis Date  . Hypertension   . DDD (degenerative disc disease), lumbar   . Sciatica   . BPH (benign prostatic hyperplasia)    Past Surgical History  Procedure Laterality Date  . Inguinal hernia repair Left 05/04/2009    Dr Rise Patience  . Colonoscopy  ~2004    "Normal" per patient   Family History  Problem Relation Age of Onset  . Heart disease Mother   . Cancer Father     prostate   History  Substance Use Topics  . Smoking status: Former Smoker    Types: Cigarettes    Quit date: 03/10/2010  . Smokeless tobacco: Never Used  . Alcohol Use: Yes     Comment: beer occ -weekends only    Review of Systems   Constitutional: Negative for fever.  HENT: Negative for congestion.   Eyes: Negative.   Respiratory: Negative for chest tightness.   Gastrointestinal: Positive for nausea, abdominal pain and abdominal distention. Negative for vomiting.  Genitourinary: Negative.   Musculoskeletal: Negative for arthralgias, joint swelling and neck pain.  Skin: Negative.  Negative for rash and wound.  Neurological: Negative for dizziness, weakness, light-headedness, numbness and headaches.  Psychiatric/Behavioral: Negative.       Allergies  Review of patient's allergies indicates no known allergies.  Home Medications   Current Outpatient Rx  Name  Route  Sig  Dispense  Refill  . methylcellulose (ARTIFICIAL TEARS) 1 % ophthalmic solution   Both Eyes   Place 1 drop into both eyes 3 (three) times daily as needed (dry eyes).         . ranitidine (ZANTAC) 150 MG tablet   Oral   Take 150 mg by mouth daily.         Marland Kitchen triamterene-hydrochlorothiazide (DYAZIDE) 37.5-25 MG per capsule   Oral   Take 1 capsule by mouth at bedtime.         Marland Kitchen acetaminophen (TYLENOL) 325 MG tablet   Oral   Take 650 mg by mouth every 6 (six) hours as needed for moderate pain.         Marland Kitchen  HYDROcodone-acetaminophen (NORCO/VICODIN) 5-325 MG per tablet   Oral   Take 1 tablet by mouth every 4 (four) hours as needed for moderate pain.   15 tablet   0   . Menthol-Methyl Salicylate (MUSCLE RUB) 10-15 % CREA   Topical   Apply 1 application topically 3 (three) times daily as needed for muscle pain.          BP 129/89  Pulse 87  Temp(Src) 98.3 F (36.8 C) (Oral)  Resp 21  Ht 6\' 3"  (1.905 m)  Wt 211 lb (95.709 kg)  BMI 26.37 kg/m2  SpO2 91% Physical Exam  Nursing note and vitals reviewed. Constitutional: He appears well-developed and well-nourished.  HENT:  Head: Normocephalic and atraumatic.  Eyes: Conjunctivae are normal.  Neck: Normal range of motion.  Cardiovascular: Normal rate, regular rhythm, normal  heart sounds and intact distal pulses.   Pulmonary/Chest: Effort normal and breath sounds normal. He has no wheezes.  Abdominal: Soft. Bowel sounds are normal. There is generalized tenderness. A hernia is present. Hernia confirmed positive in the right inguinal area.  Normal active bowel sounds in all 4 quadrants. Generalized abdominal tenderness. Obvious, right, direct, inguinal hernia noted which is very tense and tender to palpation.  Active and normal sounding bowel sounds in inguinal mass.  Musculoskeletal: Normal range of motion.  Neurological: He is alert.  Skin: Skin is warm and dry.  Psychiatric: He has a normal mood and affect.    ED Course  Procedures (including critical care time)  DIAGNOSTIC STUDIES: Oxygen Saturation is 95% on RA, adequate by my interpretation.    COORDINATION OF CARE: 10:38 AM Discussed treatment plan with pt at bedside and pt agreed to plan.  He was given morphine 8 mg IV, ativan 0.5 mg IV with improved pain.  10:52 AM Spoke with Dr. Arnoldo Morale who will come to the ED to reduce the hernia.    Labs Review Labs Reviewed  CBC WITH DIFFERENTIAL - Abnormal; Notable for the following:    Neutrophils Relative % 33 (*)    Lymphocytes Relative 53 (*)    All other components within normal limits  COMPREHENSIVE METABOLIC PANEL - Abnormal; Notable for the following:    Glucose, Bld 103 (*)    All other components within normal limits  LACTIC ACID, PLASMA   Imaging Review No results found.  EKG Interpretation   None       MDM   Final diagnoses:  Inguinal hernia, right    Per Dr. Arnoldo Morale consult,  He was able to completely reduce the hernia,  Patients pain improved.  Planned f/u with Dr Johney Maine this week as planned for definitive surgical repair.  In interim,  Recheck for any recurrence that he cannot reduce with pressure.  He was prescribed hydrocodone, but advised to not mask the pain,  He needs immediate recheck if this sx returns. Pt and wife at  bedside understands plan.  I personally performed the services described in this documentation, which was scribed in my presence. The recorded information has been reviewed and is accurate.   Evalee Jefferson, PA-C 03/13/13 1705

## 2013-03-13 NOTE — ED Notes (Signed)
Pt states that he has a right inguinal hernia that is scheduled for surgery to repair this Thursday, woke up with right lower quad abd pain this am, denies any vomiting or fever, states that he did get nauseous X1. Pt alert, moaning on the bed,

## 2013-03-13 NOTE — ED Notes (Signed)
Pt and pt's wife verbalized understanding to use caution and no driving within 4 hours of taking Vicodin due to may cause drowsiness

## 2013-03-14 ENCOUNTER — Emergency Department (HOSPITAL_COMMUNITY)
Admission: EM | Admit: 2013-03-14 | Discharge: 2013-03-14 | Disposition: A | Discharge status: Home / Self Care | Payer: BC Managed Care – PPO | Attending: Emergency Medicine | Admitting: Emergency Medicine

## 2013-03-14 ENCOUNTER — Encounter (HOSPITAL_COMMUNITY): Payer: Self-pay | Admitting: Emergency Medicine

## 2013-03-14 DIAGNOSIS — Z79899 Other long term (current) drug therapy: Secondary | ICD-10-CM | POA: Insufficient documentation

## 2013-03-14 DIAGNOSIS — Z87891 Personal history of nicotine dependence: Secondary | ICD-10-CM | POA: Insufficient documentation

## 2013-03-14 DIAGNOSIS — M51379 Other intervertebral disc degeneration, lumbosacral region without mention of lumbar back pain or lower extremity pain: Secondary | ICD-10-CM | POA: Insufficient documentation

## 2013-03-14 DIAGNOSIS — M5137 Other intervertebral disc degeneration, lumbosacral region: Secondary | ICD-10-CM | POA: Insufficient documentation

## 2013-03-14 DIAGNOSIS — Z87448 Personal history of other diseases of urinary system: Secondary | ICD-10-CM | POA: Insufficient documentation

## 2013-03-14 DIAGNOSIS — K4090 Unilateral inguinal hernia, without obstruction or gangrene, not specified as recurrent: Secondary | ICD-10-CM

## 2013-03-14 DIAGNOSIS — K460 Unspecified abdominal hernia with obstruction, without gangrene: Secondary | ICD-10-CM | POA: Insufficient documentation

## 2013-03-14 DIAGNOSIS — I10 Essential (primary) hypertension: Secondary | ICD-10-CM | POA: Insufficient documentation

## 2013-03-14 MED ORDER — ETOMIDATE 2 MG/ML IV SOLN
0.2000 mg/kg | Freq: Once | INTRAVENOUS | Status: AC
  Administered 2013-03-14: 19.14 mg via INTRAVENOUS
  Filled 2013-03-14: qty 10

## 2013-03-14 MED ORDER — MORPHINE SULFATE 4 MG/ML IJ SOLN
6.0000 mg | Freq: Once | INTRAMUSCULAR | Status: DC
  Filled 2013-03-14: qty 2

## 2013-03-14 NOTE — ED Provider Notes (Signed)
Pt with right groin pain.  pe tender right inquinal hernia.  I cannot reduce it.      Medical screening examination/treatment/procedure(s) were conducted as a shared visit with non-physician practitioner(s) and myself.  I personally evaluated the patient during the encounter.  EKG Interpretation   None         Maudry Diego, MD 03/14/13 [phone]

## 2013-03-14 NOTE — ED Provider Notes (Signed)
CSN: [id number]     Arrival date & time 03/14/13  0724 History   First MD Initiated Contact with Patient 03/14/13 0732     Chief Complaint  Patient presents with  . hernia pain       The history is provided by the patient and medical records.   patient presents with worsening pain and swelling in his right groin region.  He has a history of right inguinal hernia.  He is scheduled for repair electively in 4 days.  He was quiet reduction in the emergency department last night for an incarcerated right inguinal hernia by general surgery.  He states he went home and about 30-45 minutes later he developed recurrent pain and swelling in his right groin.  He was reduced without sedation at that time.  He denies nausea vomiting.  No abdominal distention.  No other complaints.  Pain is moderate to severe in severity at this time.   Past Medical History  Diagnosis Date  . Hypertension   . DDD (degenerative disc disease), lumbar   . Sciatica   . BPH (benign prostatic hyperplasia)    Past Surgical History  Procedure Laterality Date  . Inguinal hernia repair Left 05/04/2009    Dr Rise Patience  . Colonoscopy  ~2004    "Normal" per patient   Family History  Problem Relation Age of Onset  . Heart disease Mother   . Cancer Father     prostate   History  Substance Use Topics  . Smoking status: Former Smoker    Types: Cigarettes    Quit date: 03/10/2010  . Smokeless tobacco: Never Used  . Alcohol Use: Yes     Comment: beer occ -weekends only    Review of Systems  All other systems reviewed and are negative.      Allergies  Review of patient's allergies indicates no known allergies.  Home Medications   Current Outpatient Rx  Name  Route  Sig  Dispense  Refill  . HYDROcodone-acetaminophen (NORCO/VICODIN) 5-325 MG per tablet   Oral   Take 1 tablet by mouth every 4 (four) hours as needed for moderate pain.   15 tablet   0   . Menthol-Methyl Salicylate (MUSCLE RUB) 10-15 % CREA  Topical   Apply 1 application topically 3 (three) times daily as needed for muscle pain.         . methylcellulose (ARTIFICIAL TEARS) 1 % ophthalmic solution   Both Eyes   Place 1 drop into both eyes 3 (three) times daily as needed (dry eyes).         . ranitidine (ZANTAC) 150 MG tablet   Oral   Take 150 mg by mouth daily.         Marland Kitchen triamterene-hydrochlorothiazide (DYAZIDE) 37.5-25 MG per capsule   Oral   Take 1 capsule by mouth at bedtime.          BP 165/95  Pulse 89  Temp(Src) 98 F (36.7 C) (Oral)  Resp 21  Ht 6\' 3"  (1.905 m)  Wt 211 lb (95.709 kg)  BMI 26.37 kg/m2  SpO2 98% Physical Exam  Nursing note and vitals reviewed. Constitutional: He is oriented to person, place, and time. He appears well-developed and well-nourished.  HENT:  Head: Normocephalic.  Eyes: EOM are normal.  Neck: Normal range of motion.  Pulmonary/Chest: Effort normal.  Abdominal: He exhibits no distension.  Genitourinary:  Large incarcerated right inguinal hernia unable to be reduced at the bedside without sedation.  Easily reducible  after sedation.  Normal penis.  Normal testicles.  Musculoskeletal: Normal range of motion.  Neurological: He is alert and oriented to person, place, and time.  Psychiatric: He has a normal mood and affect.    ED Course  Hernia reduction Date/Time: 03/14/2013 8:42 AM Performed by: Hoy Morn Authorized by: Hoy Morn Consent: Verbal consent obtained. Risks and benefits: risks, benefits and alternatives were discussed Consent given by: patient Patient understanding: patient states understanding of the procedure being performed Patient consent: the patient's understanding of the procedure matches consent given Required items: required blood products, implants, devices, and special equipment available Patient identity confirmed: verbally with patient Time out: Immediately prior to procedure a "time out" was called to verify the correct patient,  procedure, equipment, support staff and site/side marked as required. Patient sedated: yes (etomidate (see note)) Vitals: Vital signs were monitored during sedation. Patient tolerance: Patient tolerated the procedure well with no immediate complications.     Procedural sedation Performed by: Hoy Morn Consent: Verbal consent obtained. Risks and benefits: risks, benefits and alternatives were discussed Required items: required blood products, implants, devices, and special equipment available Patient identity confirmed: arm band and provided demographic data Time out: Immediately prior to procedure a "time out" was called to verify the correct patient, procedure, equipment, support staff and site/side marked as required. Sedation type: moderate (conscious) sedation NPO time confirmed and considedered Sedatives: ETOMIDATE Physician Time at Bedside: 15 Vitals: Vital signs were monitored during sedation. Cardiac Monitor, pulse oximeter Patient tolerance: Patient tolerated the procedure well with no immediate complications. Comments: Pt with uneventful recovered. Returned to pre-procedural sedation baseline    Labs Review Labs Reviewed - No data to display Imaging Review No results found.  EKG Interpretation   None       MDM   Final diagnoses:  Right inguinal hernia    Patient tolerated the procedure well.  Patient ambulated around the ER without difficulty.  Pain is resolved.  Reducible inguinal hernia.    Hoy Morn, MD 03/14/13 [phone]

## 2013-03-14 NOTE — ED Notes (Signed)
Ambulated around entire ED without difficulty and no assistance. Patient denied any pain.

## 2013-03-14 NOTE — ED Notes (Signed)
Patient reports that he has hernia repair surgery scheduled for Thursday, but pain increased in the right inguinal area. Patient was seen at [facility] yesterday.  Patient also c/o nausea.

## 2013-03-14 NOTE — Discharge Instructions (Signed)

## 2013-03-17 ENCOUNTER — Ambulatory Visit (HOSPITAL_COMMUNITY)
Admission: RE | Admit: 2013-03-17 | Discharge: 2013-03-17 | Disposition: A | Discharge status: Home / Self Care | Payer: BC Managed Care – PPO | Source: Ambulatory Visit | Attending: Surgery | Admitting: Surgery

## 2013-03-17 ENCOUNTER — Encounter (HOSPITAL_COMMUNITY): Payer: BC Managed Care – PPO | Admitting: Anesthesiology

## 2013-03-17 ENCOUNTER — Emergency Department (HOSPITAL_COMMUNITY): Payer: BC Managed Care – PPO

## 2013-03-17 ENCOUNTER — Emergency Department (HOSPITAL_COMMUNITY)
Admission: EM | Admit: 2013-03-17 | Discharge: 2013-03-18 | Disposition: A | Discharge status: Home / Self Care | Payer: BC Managed Care – PPO | Attending: Emergency Medicine | Admitting: Emergency Medicine

## 2013-03-17 ENCOUNTER — Encounter (HOSPITAL_COMMUNITY)
Admission: RE | Disposition: A | Discharge status: Home / Self Care | Payer: Self-pay | Source: Ambulatory Visit | Attending: Surgery

## 2013-03-17 ENCOUNTER — Encounter (HOSPITAL_COMMUNITY): Payer: Self-pay | Admitting: Emergency Medicine

## 2013-03-17 ENCOUNTER — Ambulatory Visit (HOSPITAL_COMMUNITY): Payer: BC Managed Care – PPO | Admitting: Anesthesiology

## 2013-03-17 ENCOUNTER — Encounter (HOSPITAL_COMMUNITY): Payer: Self-pay | Admitting: *Deleted

## 2013-03-17 DIAGNOSIS — R141 Gas pain: Secondary | ICD-10-CM | POA: Insufficient documentation

## 2013-03-17 DIAGNOSIS — Z87891 Personal history of nicotine dependence: Secondary | ICD-10-CM | POA: Insufficient documentation

## 2013-03-17 DIAGNOSIS — T819XXA Unspecified complication of procedure, initial encounter: Secondary | ICD-10-CM

## 2013-03-17 DIAGNOSIS — Y838 Other surgical procedures as the cause of abnormal reaction of the patient, or of later complication, without mention of misadventure at the time of the procedure: Secondary | ICD-10-CM | POA: Insufficient documentation

## 2013-03-17 DIAGNOSIS — Z8739 Personal history of other diseases of the musculoskeletal system and connective tissue: Secondary | ICD-10-CM | POA: Insufficient documentation

## 2013-03-17 DIAGNOSIS — Z79899 Other long term (current) drug therapy: Secondary | ICD-10-CM | POA: Insufficient documentation

## 2013-03-17 DIAGNOSIS — I10 Essential (primary) hypertension: Secondary | ICD-10-CM | POA: Insufficient documentation

## 2013-03-17 DIAGNOSIS — K4020 Bilateral inguinal hernia, without obstruction or gangrene, not specified as recurrent: Secondary | ICD-10-CM | POA: Diagnosis present

## 2013-03-17 DIAGNOSIS — N400 Enlarged prostate without lower urinary tract symptoms: Secondary | ICD-10-CM

## 2013-03-17 DIAGNOSIS — K4090 Unilateral inguinal hernia, without obstruction or gangrene, not specified as recurrent: Secondary | ICD-10-CM

## 2013-03-17 DIAGNOSIS — Z87448 Personal history of other diseases of urinary system: Secondary | ICD-10-CM | POA: Insufficient documentation

## 2013-03-17 DIAGNOSIS — IMO0002 Reserved for concepts with insufficient information to code with codable children: Secondary | ICD-10-CM | POA: Insufficient documentation

## 2013-03-17 DIAGNOSIS — R142 Eructation: Secondary | ICD-10-CM | POA: Insufficient documentation

## 2013-03-17 DIAGNOSIS — R143 Flatulence: Secondary | ICD-10-CM

## 2013-03-17 HISTORY — PX: INSERTION OF MESH: SHX5868

## 2013-03-17 HISTORY — PX: INGUINAL HERNIA REPAIR: SHX194

## 2013-03-17 SURGERY — REPAIR, HERNIA, INGUINAL, LAPAROSCOPIC
Anesthesia: General | Site: Groin | Laterality: Bilateral

## 2013-03-17 MED ORDER — GLYCOPYRROLATE 0.2 MG/ML IJ SOLN
INTRAMUSCULAR | Status: AC
  Filled 2013-03-17: qty 3

## 2013-03-17 MED ORDER — ONDANSETRON HCL 4 MG/2ML IJ SOLN
INTRAMUSCULAR | Status: DC | PRN
  Administered 2013-03-17 (×2): 2 mg via INTRAVENOUS

## 2013-03-17 MED ORDER — OXYCODONE HCL 5 MG PO TABS: 5.0000 mg | ORAL_TABLET | Freq: Once | ORAL | Status: DC | PRN

## 2013-03-17 MED ORDER — LACTATED RINGERS IR SOLN
Status: DC | PRN
  Administered 2013-03-17: 1

## 2013-03-17 MED ORDER — OXYCODONE HCL 5 MG PO TABS: 5.0000 mg | ORAL_TABLET | ORAL | Status: DC | PRN

## 2013-03-17 MED ORDER — PROMETHAZINE HCL 25 MG/ML IJ SOLN: 6.2500 mg | INTRAMUSCULAR | Status: DC | PRN

## 2013-03-17 MED ORDER — PROPOFOL 10 MG/ML IV BOLUS
INTRAVENOUS | Status: AC
  Filled 2013-03-17: qty 20

## 2013-03-17 MED ORDER — DEXAMETHASONE SODIUM PHOSPHATE 10 MG/ML IJ SOLN
INTRAMUSCULAR | Status: DC | PRN
  Administered 2013-03-17: 10 mg via INTRAVENOUS

## 2013-03-17 MED ORDER — NEOSTIGMINE METHYLSULFATE 1 MG/ML IJ SOLN
INTRAMUSCULAR | Status: DC | PRN
  Administered 2013-03-17: 3 mg via INTRAVENOUS

## 2013-03-17 MED ORDER — CISATRACURIUM BESYLATE 20 MG/10ML IV SOLN
INTRAVENOUS | Status: AC
Start: 2013-03-17 — End: 2013-03-17
  Filled 2013-03-17: qty 10

## 2013-03-17 MED ORDER — CEFAZOLIN SODIUM-DEXTROSE 2-3 GM-% IV SOLR: 2.0000 g | Freq: Once | INTRAVENOUS | Status: DC

## 2013-03-17 MED ORDER — LACTATED RINGERS IV SOLN
INTRAVENOUS | Status: DC
  Administered 2013-03-17: 1000 mL via INTRAVENOUS
  Administered 2013-03-17: 12:00:00 via INTRAVENOUS

## 2013-03-17 MED ORDER — GLYCOPYRROLATE 0.2 MG/ML IJ SOLN
INTRAMUSCULAR | Status: DC | PRN
  Administered 2013-03-17: .2 mg via INTRAVENOUS

## 2013-03-17 MED ORDER — PHENYLEPHRINE HCL 10 MG/ML IJ SOLN
INTRAMUSCULAR | Status: DC | PRN
  Administered 2013-03-17 (×3): 20 ug via INTRAVENOUS

## 2013-03-17 MED ORDER — MEPERIDINE HCL 50 MG/ML IJ SOLN: 6.2500 mg | INTRAMUSCULAR | Status: DC | PRN

## 2013-03-17 MED ORDER — LIDOCAINE HCL (CARDIAC) 20 MG/ML IV SOLN
INTRAVENOUS | Status: AC
  Filled 2013-03-17: qty 5

## 2013-03-17 MED ORDER — SUCCINYLCHOLINE CHLORIDE 20 MG/ML IJ SOLN
INTRAMUSCULAR | Status: DC | PRN
  Administered 2013-03-17: 100 mg via INTRAVENOUS

## 2013-03-17 MED ORDER — BUPIVACAINE-EPINEPHRINE 0.25% -1:200000 IJ SOLN
INTRAMUSCULAR | Status: DC | PRN
  Administered 2013-03-17: 90 mL

## 2013-03-17 MED ORDER — 0.9 % SODIUM CHLORIDE (POUR BTL) OPTIME
TOPICAL | Status: DC | PRN
  Administered 2013-03-17: 1000 mL

## 2013-03-17 MED ORDER — OXYCODONE HCL 5 MG/5ML PO SOLN
5.0000 mg | Freq: Once | ORAL | Status: DC | PRN
  Filled 2013-03-17: qty 5

## 2013-03-17 MED ORDER — KETOROLAC TROMETHAMINE 30 MG/ML IJ SOLN
INTRAMUSCULAR | Status: AC
  Filled 2013-03-17: qty 1

## 2013-03-17 MED ORDER — CEFAZOLIN SODIUM-DEXTROSE 2-3 GM-% IV SOLR
INTRAVENOUS | Status: AC
  Filled 2013-03-17: qty 50

## 2013-03-17 MED ORDER — BUPIVACAINE-EPINEPHRINE 0.25% -1:200000 IJ SOLN
INTRAMUSCULAR | Status: AC
  Filled 2013-03-17: qty 2

## 2013-03-17 MED ORDER — MIDAZOLAM HCL 2 MG/2ML IJ SOLN
INTRAMUSCULAR | Status: AC
  Filled 2013-03-17: qty 2

## 2013-03-17 MED ORDER — ONDANSETRON HCL 4 MG/2ML IJ SOLN
INTRAMUSCULAR | Status: AC
  Filled 2013-03-17: qty 2

## 2013-03-17 MED ORDER — MIDAZOLAM HCL 5 MG/5ML IJ SOLN
INTRAMUSCULAR | Status: DC | PRN
  Administered 2013-03-17 (×2): 1 mg via INTRAVENOUS

## 2013-03-17 MED ORDER — CISATRACURIUM BESYLATE (PF) 10 MG/5ML IV SOLN
INTRAVENOUS | Status: DC | PRN
  Administered 2013-03-17 (×2): 2 mg via INTRAVENOUS
  Administered 2013-03-17: 8 mg via INTRAVENOUS

## 2013-03-17 MED ORDER — FENTANYL CITRATE 0.05 MG/ML IJ SOLN
INTRAMUSCULAR | Status: DC | PRN
  Administered 2013-03-17 (×4): 50 ug via INTRAVENOUS
  Administered 2013-03-17: 100 ug via INTRAVENOUS
  Administered 2013-03-17: 150 ug via INTRAVENOUS
  Administered 2013-03-17: 50 ug via INTRAVENOUS

## 2013-03-17 MED ORDER — LIDOCAINE HCL (CARDIAC) 20 MG/ML IV SOLN
INTRAVENOUS | Status: DC | PRN
  Administered 2013-03-17: 100 mg via INTRAVENOUS

## 2013-03-17 MED ORDER — FENTANYL CITRATE 0.05 MG/ML IJ SOLN
INTRAMUSCULAR | Status: AC
Start: 2013-03-17 — End: 2013-03-17
  Filled 2013-03-17: qty 5

## 2013-03-17 MED ORDER — PHENYLEPHRINE HCL 10 MG/ML IJ SOLN
INTRAMUSCULAR | Status: AC
  Filled 2013-03-17: qty 1

## 2013-03-17 MED ORDER — HYDROMORPHONE HCL PF 1 MG/ML IJ SOLN: 0.2500 mg | INTRAMUSCULAR | Status: DC | PRN

## 2013-03-17 MED ORDER — FENTANYL CITRATE 0.05 MG/ML IJ SOLN
100.0000 ug | Freq: Once | INTRAMUSCULAR | Status: AC
  Administered 2013-03-17: 100 ug via INTRAVENOUS
  Filled 2013-03-17: qty 2

## 2013-03-17 MED ORDER — EPHEDRINE SULFATE 50 MG/ML IJ SOLN
INTRAMUSCULAR | Status: DC | PRN
  Administered 2013-03-17 (×2): 2.5 mg via INTRAVENOUS
  Administered 2013-03-17: 5 mg via INTRAVENOUS

## 2013-03-17 MED ORDER — IOHEXOL 300 MG/ML  SOLN
50.0000 mL | Freq: Once | INTRAMUSCULAR | Status: AC | PRN
  Administered 2013-03-17: 50 mL via ORAL

## 2013-03-17 MED ORDER — DEXAMETHASONE SODIUM PHOSPHATE 10 MG/ML IJ SOLN
INTRAMUSCULAR | Status: AC
  Filled 2013-03-17: qty 1

## 2013-03-17 MED ORDER — FENTANYL CITRATE 0.05 MG/ML IJ SOLN
INTRAMUSCULAR | Status: AC
  Filled 2013-03-17: qty 5

## 2013-03-17 MED ORDER — NEOSTIGMINE METHYLSULFATE 1 MG/ML IJ SOLN
INTRAMUSCULAR | Status: AC
  Filled 2013-03-17: qty 10

## 2013-03-17 MED ORDER — KETOROLAC TROMETHAMINE 30 MG/ML IJ SOLN
INTRAMUSCULAR | Status: DC | PRN
  Administered 2013-03-17: 30 mg via INTRAVENOUS

## 2013-03-17 MED ORDER — SODIUM CHLORIDE 0.9 % IV BOLUS (SEPSIS)
500.0000 mL | Freq: Once | INTRAVENOUS | Status: AC
  Administered 2013-03-17: 500 mL via INTRAVENOUS

## 2013-03-17 MED ORDER — PROPOFOL 10 MG/ML IV BOLUS
INTRAVENOUS | Status: DC | PRN
  Administered 2013-03-17: 200 mg via INTRAVENOUS

## 2013-03-17 SURGICAL SUPPLY — 32 items
CABLE HIGH FREQUENCY MONO STRZ (ELECTRODE) ×3 IMPLANT
CANISTER SUCTION 2500CC (MISCELLANEOUS) IMPLANT
CHLORAPREP W/TINT 26ML (MISCELLANEOUS) ×3 IMPLANT
DECANTER SPIKE VIAL GLASS SM (MISCELLANEOUS) ×6 IMPLANT
DEVICE SECURE STRAP 25 ABSORB (INSTRUMENTS) IMPLANT
DRAPE LAPAROSCOPIC ABDOMINAL (DRAPES) ×3 IMPLANT
DRAPE UTILITY XL STRL (DRAPES) ×3 IMPLANT
DRAPE WARM FLUID 44X44 (DRAPE) ×3 IMPLANT
DRSG TEGADERM 2-3/8X2-3/4 SM (GAUZE/BANDAGES/DRESSINGS) ×6 IMPLANT
DRSG TEGADERM 4X4.75 (GAUZE/BANDAGES/DRESSINGS) ×3 IMPLANT
ELECT REM PT RETURN 9FT ADLT (ELECTROSURGICAL) ×3
ELECTRODE REM PT RTRN 9FT ADLT (ELECTROSURGICAL) ×2 IMPLANT
GLOVE BIOGEL M 6.5 STRL (GLOVE) ×3 IMPLANT
GLOVE BIOGEL PI IND STRL 6.5 (GLOVE) ×2 IMPLANT
GLOVE BIOGEL PI INDICATOR 6.5 (GLOVE) ×1
GLOVE ECLIPSE 8.0 STRL XLNG CF (GLOVE) ×3 IMPLANT
GLOVE INDICATOR 8.0 STRL GRN (GLOVE) ×3 IMPLANT
GOWN STRL NON-REIN LRG LVL3 (GOWN DISPOSABLE) ×3 IMPLANT
GOWN STRL REUS W/TWL XL LVL3 (GOWN DISPOSABLE) ×6 IMPLANT
KIT BASIN OR (CUSTOM PROCEDURE TRAY) ×3 IMPLANT
MESH ULTRAPRO 6X6 15CM15CM (Mesh General) ×6 IMPLANT
SCISSORS LAP 5X35 DISP (ENDOMECHANICALS) ×3 IMPLANT
SET IRRIG TUBING LAPAROSCOPIC (IRRIGATION / IRRIGATOR) IMPLANT
SLEEVE XCEL OPT CAN 5 100 (ENDOMECHANICALS) ×3 IMPLANT
SUT MNCRL AB 4-0 PS2 18 (SUTURE) ×3 IMPLANT
TACKER 5MM HERNIA 3.5CML NAB (ENDOMECHANICALS) IMPLANT
TOWEL OR 17X26 10 PK STRL BLUE (TOWEL DISPOSABLE) ×3 IMPLANT
TOWEL OR NON WOVEN STRL DISP B (DISPOSABLE) ×3 IMPLANT
TRAY LAP CHOLE (CUSTOM PROCEDURE TRAY) ×3 IMPLANT
TROCAR BLADELESS OPT 5 100 (ENDOMECHANICALS) ×3 IMPLANT
TROCAR XCEL BLUNT TIP 100MML (ENDOMECHANICALS) ×3 IMPLANT
TUBING INSUFFLATION 10FT LAP (TUBING) ×3 IMPLANT

## 2013-03-17 NOTE — Anesthesia Postprocedure Evaluation (Signed)
Anesthesia Post Note  Patient: Trevor Rangel  Procedure(s) Performed: Procedure(s) (LRB): LAPAROSCOPIC EXPLORATION AND REPAIR OF BILATERAL INGUINAL HERNIAS (Bilateral) INSERTION OF MESH (Bilateral)  Anesthesia type: General  Patient location: PACU  Post pain: Pain level controlled  Post assessment: Post-op Vital signs reviewed  Last Vitals: BP 137/90  Pulse 80  Temp(Src) 36.8 C (Oral)  Resp 18  SpO2 99%  Post vital signs: Reviewed  Level of consciousness: sedated  Complications: No apparent anesthesia complications

## 2013-03-17 NOTE — H&P (Signed)
Trevor Rangel  August 13, 1950 [id number]  CARE TEAM:  PCP: Trevor Drafts., FNP  Outpatient Care Team: Patient Care Team: Trevor Gladden. Trevor Rangel as PCP - General (Nurse Practitioner)  Inpatient Treatment Team: Treatment Team: Attending Provider: [name], MD  This patient is a 63 y.o.male who presents today for surgical evaluation at the request of Trevor. [name], NP.  Reason for visit: Right inguinal hernia   Was an active male. History of left inguinal hernia requiring open repair by Trevor. Rise Rangel 2011. Patient noted some groin swelling on the opposite, right side. It is gradually increased. It is more sensitive. He does struggle with some mild urinary BPH issues but that is stable on the Hytrin. Normally has a daily bowel movement. He walks several miles a day without difficulty. Has a bowel movement every day. No history of skin infections or MRSA.  I recommended surgery when I saw him in [id number].  He was going to think about it.  Had a painful episode where the [facility] had to be reduced in the [facility] ED earlier this month.  Now wants surgery.   Past Medical History  Diagnosis Date  . Hypertension   . DDD (degenerative disc disease), lumbar   . Sciatica   . BPH (benign prostatic hyperplasia)     Past Surgical History  Procedure Laterality Date  . Inguinal hernia repair Left 05/04/2009    Trevor Rangel  . Colonoscopy  ~2004    "Normal" per patient    History   Social History  . Marital Status: Single    Spouse Name: N/A    Number of Children: N/A  . Years of Education: N/A   Occupational History  . Not on file.   Social History Main Topics  . Smoking status: Former Smoker    Types: Cigarettes    Quit date: 03/10/2010  . Smokeless tobacco: Never Used  . Alcohol Use: Yes     Comment: beer occ -weekends only  . Drug Use: No  . Sexual Activity: Yes   Other Topics Concern  . Not on file   Social History Narrative  . No narrative on file    Family History   Problem Relation Age of Onset  . Heart disease Mother   . Cancer Father     prostate    Current Facility-Administered Medications  Medication Dose Route Frequency Provider Last Rate Last Dose  . lactated ringers infusion   Intravenous Continuous Trevor Retort, MD 100 mL/hr at 03/17/13 0835 1,000 mL at 03/17/13 0835     No Known Allergies  ROS: Constitutional:  No fevers, chills, sweats.  Weight stable Eyes:  No vision changes, No discharge HENT:  No sore throats, nasal drainage Lymph: No neck swelling, No bruising easily Pulmonary:  No cough, productive sputum CV: No orthopnea, PND    No exertional chest/neck/shoulder/arm pain. GI: No personal nor family history of GI/colon cancer, inflammatory bowel disease, irritable bowel syndrome, allergy such as Celiac Sprue, dietary/dairy problems, colitis, ulcers nor gastritis.  No recent sick contacts/gastroenteritis.  No travel outside the country.  No changes in diet. Renal: No UTIs, No hematuria Genital:  No drainage, bleeding, masses Musculoskeletal: No severe joint pain.  Good ROM major joints Skin:  No sores or lesions.  No rashes Heme/Lymph:  No easy bleeding.  No swollen lymph nodes Neuro: No focal weakness/numbness.  No seizures Psych: No suicidal ideation.  No hallucinations  BP 164/95  Pulse 86  Temp(Src) 98 F (36.7 C) (  Oral)  Resp 18  SpO2 96%  Physical Exam: General: Pt awake/alert/oriented x4 in no major acute distress Eyes: PERRL, normal EOM. Sclera nonicteric Neuro: CN II-XII intact w/o focal sensory/motor deficits. Lymph: No head/neck/groin lymphadenopathy Psych:  No delerium/psychosis/paranoia HENT: Normocephalic, Mucus membranes moist.  No thrush Neck: Supple, No tracheal deviation Chest: No pain.  Good respiratory excursion. CV:  Pulses intact.  Regular rhythm Abdomen: Soft, Nondistended.  RIH sensitive.  Mild left groin sensitivity.  OLD LIH scar Ext:  SCDs BLE.  No significant edema.  No  cyanosis Skin: No petechiae / purpurea.  No major sores Musculoskeletal: No severe joint pain.  Good ROM major joints   Results:   Labs: No results found for this or any previous visit (from the past 48 hour(s)).  Imaging / Studies: Dg Chest 2 View  03/10/2013   CLINICAL DATA:  63 year old male preoperative study for hernia repair. Hypertension. Initial encounter.  EXAM: CHEST  2 VIEW  COMPARISON:  02/03/2009.  FINDINGS: Lung volumes remain normal. Cardiac size at the upper limits of normal. Other mediastinal contours are within normal limits. No pneumothorax, pulmonary edema pleural effusion or confluent pulmonary opacity. No acute osseous abnormality identified.  IMPRESSION: No acute cardiopulmonary abnormality.   Electronically Signed   By: Trevor Rangel M.D.   On: 03/10/2013 13:49    Medications / Allergies: per chart  Antibiotics: Anti-infectives   None      Assessment  Trevor Rangel  63 y.o. male  Day of Surgery  Procedure(s): LAPAROSCOPIC EXPLORATION AND REPAIR OF HERNIAS IN RIGHT AND POSSIBLY LEFT GROINS INSERTION OF MESH  Problem List:  Active Problems:   Inguinal hernia, right   RIH, h/o open LIH repair  Plan:  Lap exploration & repair of hernia(s):  The anatomy & physiology of the abdominal wall and pelvic floor was discussed.  The pathophysiology of hernias in the inguinal and pelvic region was discussed.  Natural history risks such as progressive enlargement, pain, incarceration & strangulation was discussed.   Contributors to complications such as smoking, obesity, diabetes, prior surgery, etc were discussed.    I feel the risks of no intervention will lead to serious problems that outweigh the operative risks; therefore, I recommended surgery to reduce and repair the hernia.  I explained laparoscopic techniques with possible need for an open approach.  I noted usual use of mesh to patch and/or buttress hernia repair  Risks such as bleeding, infection, abscess,  need for further treatment, heart attack, death, and other risks were discussed.  I noted a good likelihood this will help address the problem.   Goals of post-operative recovery were discussed as well.  Possibility that this will not correct all symptoms was explained.  I stressed the importance of low-impact activity, aggressive pain control, avoiding constipation, & not pushing through pain to minimize risk of post-operative chronic pain or injury. Possibility of reherniation was discussed.  We will work to minimize complications.     An educational handout further explaining the pathology & treatment options was given as well.  Questions were answered.  The patient expresses understanding & wishes to proceed with surgery.      Trevor Rangel, M.D., F.A.C.S. Gastrointestinal and Minimally Invasive Surgery [facility] [address], [place], [place], [place] [address] [phone] Main / Paging   03/17/2013  Note: This dictation was prepared with Dragon/digital dictation along with [facility] technology. Any transcriptional errors that result from this process are unintentional.

## 2013-03-17 NOTE — Anesthesia Preprocedure Evaluation (Addendum)
Anesthesia Evaluation  Patient identified by MRN, date of birth, ID band Patient awake    Reviewed: Allergy & Precautions, H&P , NPO status , Patient's Chart, lab work & pertinent test results  Airway Mallampati: II TM Distance: >3 FB Neck ROM: Full    Dental  (+) Dental Advisory Given   Pulmonary neg pulmonary ROS, former smoker,  breath sounds clear to auscultation        Cardiovascular hypertension, Pt. on medications Rhythm:Regular Rate:Normal     Neuro/Psych negative neurological ROS  negative psych ROS   GI/Hepatic negative GI ROS, Neg liver ROS,   Endo/Other  negative endocrine ROS  Renal/GU negative Renal ROS     Musculoskeletal negative musculoskeletal ROS (+)   Abdominal   Peds  Hematology negative hematology ROS (+)   Anesthesia Other Findings   Reproductive/Obstetrics                          Anesthesia Physical Anesthesia Plan  ASA: II  Anesthesia Plan: General   Post-op Pain Management:    Induction: Intravenous  Airway Management Planned: Oral ETT  Additional Equipment:   Intra-op Plan:   Post-operative Plan: Extubation in OR  Informed Consent: I have reviewed the patients History and Physical, chart, labs and discussed the procedure including the risks, benefits and alternatives for the proposed anesthesia with the patient or authorized representative who has indicated his/her understanding and acceptance.   Dental advisory given  Plan Discussed with: CRNA  Anesthesia Plan Comments:         Anesthesia Quick Evaluation

## 2013-03-17 NOTE — Discharge Instructions (Signed)
HERNIA REPAIR: POST OP INSTRUCTIONS ° °1. DIET: Follow a light bland diet the first 24 hours after arrival home, such as soup, liquids, crackers, etc.  Be sure to include lots of fluids daily.  Avoid fast food or heavy meals as your are more likely to get nauseated.  Eat a low fat the next few days after surgery. °2. Take your usually prescribed home medications unless otherwise directed. °3. PAIN CONTROL: °a. Pain is best controlled by a usual combination of three different methods TOGETHER: °i. Ice/Heat °ii. Over the counter pain medication °iii. Prescription pain medication °b. Most patients will experience some swelling and bruising around the hernia(s) such as the bellybutton, groins, or old incisions.  Ice packs or heating pads (30-60 minutes up to 6 times a day) will help. Use ice for the first few days to help decrease swelling and bruising, then switch to heat to help relax tight/sore spots and speed recovery.  Some people prefer to use ice alone, heat alone, alternating between ice & heat.  Experiment to what works for you.  Swelling and bruising can take several weeks to resolve.   °c. It is helpful to take an over-the-counter pain medication regularly for the first few weeks.  Choose one of the following that works best for you: °i. Naproxen (Aleve, etc)  Two 220mg tabs twice a day °ii. Ibuprofen (Advil, etc) Three 200mg tabs four times a day (every meal & bedtime) °iii. Acetaminophen (Tylenol, etc) 325-650mg four times a day (every meal & bedtime) °d. A  prescription for pain medication should be given to you upon discharge.  Take your pain medication as prescribed.  °i. If you are having problems/concerns with the prescription medicine (does not control pain, nausea, vomiting, rash, itching, etc), please call us [phone] to see if we need to switch you to a different pain medicine that will work better for you and/or control your side effect better. °ii. If you need a refill on your pain  medication, please contact your pharmacy.  They will contact our office to request authorization. Prescriptions will not be filled after 5 pm or on week-ends. °4. Avoid getting constipated.  Between the surgery and the pain medications, it is common to experience some constipation.  Increasing fluid intake and taking a fiber supplement (such as Metamucil, Citrucel, FiberCon, MiraLax, etc) 1-2 times a day regularly will usually help prevent this problem from occurring.  A mild laxative (prune juice, Milk of Magnesia, MiraLax, etc) should be taken according to package directions if there are no bowel movements after 48 hours.   °5. Wash / shower every day.  You may shower over the dressings as they are waterproof.   °6. Remove your waterproof bandages 5 days after surgery.  You may leave the incision open to air.  You may replace a dressing/Band-Aid to cover the incision for comfort if you wish.  Continue to shower over incision(s) after the dressing is off. ° ° ° °7. ACTIVITIES as tolerated:   °a. You may resume regular (light) daily activities beginning the next day--such as daily self-care, walking, climbing stairs--gradually increasing activities as tolerated.  If you can walk 30 minutes without difficulty, it is safe to try more intense activity such as jogging, treadmill, bicycling, low-impact aerobics, swimming, etc. °b. Save the most intensive and strenuous activity for last such as sit-ups, heavy lifting, contact sports, etc  Refrain from any heavy lifting or straining until you are off narcotics for pain control.   °  c. DO NOT PUSH THROUGH PAIN.  Let pain be your guide: If it hurts to do something, don't do it.  Pain is your body warning you to avoid that activity for another week until the pain goes down. d. You may drive when you are no longer taking prescription pain medication, you can comfortably wear a seatbelt, and you can safely maneuver your car and apply brakes. e. Dennis Bast may have sexual intercourse  when it is comfortable.  8. FOLLOW UP in our office a. Please call [facility] at [phone] to set up an appointment to see your surgeon in the office for a follow-up appointment approximately 2-3 weeks after your surgery. b. Make sure that you call for this appointment the day you arrive home to insure a convenient appointment time. 9.  IF YOU HAVE DISABILITY OR FAMILY LEAVE FORMS, BRING THEM TO THE OFFICE FOR PROCESSING.  DO NOT GIVE THEM TO YOUR DOCTOR.  WHEN TO CALL us [phone]: 1. Poor pain control 2. Reactions / problems with new medications (rash/itching, nausea, etc)  3. Fever over 101.5 F (38.5 C) 4. Inability to urinate 5. Nausea and/or vomiting 6. Worsening swelling or bruising 7. Continued bleeding from incision. 8. Increased pain, redness, or drainage from the incision   The clinic staff is available to answer your questions during regular business hours (8:30am-5pm).  Please dont hesitate to call and ask to speak to one of our nurses for clinical concerns.   If you have a medical emergency, go to the nearest emergency room or call 911.  A surgeon from [facility] is always on call at the hospitals in [facility], [place], [place], [place], [place]  [address] ?  [address] [address], [place], [place]   [address] MAIN: [phone] ? TOLL FREE: [phone] ? FAX: [phone] [email or web address]   Clean Intermittent Catheterization, Male Clean intermittent catheterization (CIC) refers to emptying urine from the bladder with a small, flexible tube (catheter). The bladder is an organ in the body that stores urine. Urine may need to be drained from your bladder with a catheter if:  There is an obstruction to the flow of urine out of the bladder or through the urinary tract.  The bladder muscles or nerves are not functioning properly to allow normal flow of urine out of the bladder. Emptying the bladder regularly  will help prevent further permanent bladder or kidney damage. SUPPLIES FOR CIC You will need:   The specific type and size of catheter as directed by your caregiver.  Water-soluble, lubricating jelly (if the catheter is not pre-lubricated). Do not use an oil-based lubricant.  A warm, soapy washcloth or pre-moistened wipes.  A container to collect the urine (if you are not using the toilet).  A container or bag to store the catheter. HOW TO PERFORM CIC Follow these steps for a clean technique: 1. Collect supplies and place them within your reach. 2. Wash your hands thoroughly with soap and water. 3. Get in a comfortable position. Positions include:  Sitting on a toilet, wheelchair, chair, or edge of bed.  Standing near a toilet.  Lying down with your head raised on pillows. 4. Position the collection container between your legs (if you are not using the toilet). 5. Urinate (if you are able). 6. Place water-soluble, lubricating jelly on about 2 inches (5 cm) of the tip of the catheter (if the catheter is not pre-lubricated). 7. Set the catheter down on  a clean, dry surface within reach. 8. Hold the penis and gently stretch it out from your body. Pull back any skin covering the end of the penis (foreskin) and wash thoroughly. Wash the penis with the warm, soapy washcloth or pre-moistened wipes. Start by washing at the tip and continue washing towards the base of the penis. 9. Hold the penis upward at a 45 to 60angle. This helps to straighten the urethra. 10. Relax. 11. Insert the catheter gently into the penis. If you meet resistance during insertion, stop and adjust the position of the penis to a 90 angle. Insert the catheter until urine starts to flow, usually 6 to 8 inches (15 to 20 cm). When urine starts to flow, insert the catheter about 1 inch (3 cm) more. 12. When the urine stops flowing, strain or gently push on the lower abdominal muscles to help the bladder drain  fully. 13. Gently pull the catheter out in a downward motion. 14. Pull the foreskin back down over the tip of the penis. 15. Wash the penis. 16. Report any changes in the urine to your caregiver. 17. Discard the urine. 18. If you are using a multiple use catheter, wash the catheter as directed by your caregiver. Rinse. Allow to air dry. Store the catheter in a clean, dry container or bag. 19. Wash your hands. HOME CARE INSTRUCTIONS   Drink 6 to 8 glasses of fluid each day. Avoid caffeine. Caffeine may make you have to urinate more frequently and more urgently.  Empty your bladder every 4 to 6 hours or as directed by your caregiver.  Perform a CIC if you have symptoms of too much urine in your bladder (overdistension) and you are not able to urinate. Symptoms of overdistension include:  Restlessness.  Sweating or chills.  Headache.  Flushed or pale color.  Cold limbs.  Bloated lower abdomen.  Discard a multiple use catheter when it becomes dry, brittle, or cloudy (usually after 1 week of use).  Take all medications as directed by your caregiver. SEEK MEDICAL CARE IF:  You are having trouble performing any of the steps.  You are leaking urine.  You have pain when you urinate.  You notice blood in your urine.  You feel the need to empty your bladder (void) often.  Your urine is cloudy or smells different.  You have pain in your abdomen.  You develop a rash or sores. SEEK IMMEDIATE MEDICAL CARE IF:  You have a fever or persistent symptoms for more than 72 hours.  You have a fever and your symptoms suddenly get worse.  Your pain becomes severe. Document Released: 02/15/2010 Document Revised: 10/08/2011 Document Reviewed: 02/15/2010 [facility] Patient Information 2014 [place], [place].   Benign Prostatic Hypertrophy  The prostate gland is part of the reproductive system of men. A normal prostate is about the size and shape of a walnut. The prostate gland produces a  fluid that is mixed with sperm to make semen. This gland surrounds the urethra and is located in front of the rectum and just below the bladder. The bladder is where urine is stored. The urethra is the tube through which urine passes from the bladder to get out of the body. The prostate grows as a man ages. An enlarged prostate not caused by cancer is called benign prostatic hypertrophy (BPH). An enlarged prostate can press on the urethra. This can make it harder to pass urine. In the early stages of enlargement, the bladder can get by with a  narrowed urethra by forcing the urine through. If the problem gets worse, medical or surgical treatment may be required.  This condition should be followed by your health care provider. The accumulation of urine in the bladder can cause infection. Back pressure and infection can progress to bladder damage and kidney (renal) failure. If needed, your health care provider may refer you to a specialist in kidney and prostate disease (urologist). CAUSES  BPH is a common health problem in men older than 50 years. This condition is a normal part of aging. However, not all men will develop problems from this condition. If the enlargement grows away from the urethra, then there will not be any compression of the urethra and resistance to urine flow.If the growth is toward the urethra and compresses it, you will experience difficulty urinating.  SYMPTOMS   Not able to completely empty your bladder.  Getting up often during the night to urinate.  Need to urinate frequently during the day.  Difficultly starting urine flow.  Decrease in size and strength of your urine stream.  Dribbling after urination.  Pain on urination (more common with infection).  Inability to pass urine. This needs immediate treatment.  The development of a urinary tract infection. DIAGNOSIS  These tests will help your health care provider understand your problem:  A thorough history and  physical examination.  A urination history, with the number of times you urinate, the amounts of urine, the strength of the urine stream, and the feeling of emptiness or fullness after urinating.  A postvoid bladder scan that measures any amount of urine that may remain in your bladder after you finish urinating.  Digital rectal exam. In a rectal exam, your health care provider checks your prostate by putting a gloved, lubricated finger into your rectum to feel the back of your prostate gland. This exam detects the size of your gland and abnormal lumps or growths.  Exam of your urine (urinalysis).  Prostate specific antigen (PSA) screening. This is a blood test used to screen for prostate cancer.  Rectal ultrasonography. This test uses sound waves to electronically produce a picture of your prostate gland. TREATMENT  Once symptoms begin, your health care provider will monitor your condition. Of the men with this condition, one third will have symptoms that stabilize, one third will have symptoms that improve, and one third will have symptoms that progress in the first year. Mild symptoms may not need treatment. Simple observation and yearly exams may be all that is required. Medicines and surgery are options for more severe problems. Your health care provider can help you make an informed decision for what is best. Two classes of medicines are available for relief of prostate symptoms:  Medicines that shrink the prostate. This helps relieve symptoms. These medicines take time to work, and it may be months before any improvement is seen.  Uncommon side effects include problems with sexual function.  Medicines to relax the muscle of the prostate. This also relieves the obstruction by reducing any compression on the urethra.This group of medicines work much faster than those that reduce the size of the prostate gland. Usually, one can experience improvement in days to weeks..  Side effects can  include dizziness, fatigue, lightheadedness, and retrograde ejaculation (diminished volume of ejaculate). Several types of surgical treatments are available for relief of prostate symptoms:  Transurethral resection of the prostate (TURP) In this treatment, an instrument is inserted through opening at the tip of the penis. It is used  to cut away pieces of the inner core of the prostate. The pieces are removed through the same opening of the penis. This removes the obstruction and helps get rid of the symptoms.  Transurethral incision (TUIP) In this procedure, small cuts are made in the prostate. This lessens the prostates pressure on the urethra.  Transurethral microwave thermotherapy (TUMT) This procedure uses microwaves to create heat. The heat destroys and removes a small amount of prostate tissue.  Transurethral needle ablation (TUNA) This is a procedure that uses radio frequencies to do the same as TUMT.  Interstitial laser coagulation (ILC) This is a procedure that uses a laser to do the same as TUMT and TUNA.  Transurethral electrovaporization (TUVP) This is a procedure that uses electrodes to do the same as the procedures listed above. SEEK MEDICAL CARE IF:   You develop a fever.  There is unexplained back pain.  Symptoms are not helped by medicines prescribed.  You develop side effects from the medicine you are taking.  Your urine becomes very dark or has a bad smell.  Your lower abdomen becomes distended and you have difficulty passing your urine. SEEK IMMEDIATE MEDICAL CARE IF:   You are suddenly unable to urinate. This is an emergency. You should be seen immediately.  There are large amounts of blood or clots in the urine.  Your urinary problems become unmanageable.  You develop lightheadedness, severe dizziness, or you feel faint.  You develop moderate to severe low back or flank pain.  You develop chills or fever. Document Released: 01/13/2005 Document Revised:  11/03/2012 Document Reviewed: 07/29/2012 [facility] Patient Information 2014 [place], [place].

## 2013-03-17 NOTE — ED Provider Notes (Addendum)
CSN: [id number]     Arrival date & time 03/17/13  1844 History  This chart was scribed for [employer]. Alvino Chapel, MD by Elby Beck, ED Scribe. This patient was seen in room APA15/APA15 and the patient's care was started at 6:57 PM.   Chief Complaint  Patient presents with  . Post-op Problem    The history is provided by the patient. No language interpreter was used.    HPI Comments: Trevor Rangel is a 63 y.o. male who presents to the Emergency Department complaining of a post-op problem after a laparoscopic hernia repair surgery he had performed earlier today at 10:00 AM at [facility]. He states that he got home today at 3 PM, and later in the day while going to the bathroom, he noticed bleeding when from the surgical site. He denies any other pain or symptoms. He does report having a prior inguinal hernia repair surgery on 2011.    Past Medical History  Diagnosis Date  . Hypertension   . DDD (degenerative disc disease), lumbar   . Sciatica   . BPH (benign prostatic hyperplasia)    Past Surgical History  Procedure Laterality Date  . Inguinal hernia repair Left 05/04/2009    Dr Rise Patience  . Colonoscopy  ~2004    "Normal" per patient   Family History  Problem Relation Age of Onset  . Heart disease Mother   . Cancer Father     prostate   History  Substance Use Topics  . Smoking status: Former Smoker    Types: Cigarettes    Quit date: 03/10/2010  . Smokeless tobacco: Never Used  . Alcohol Use: Yes     Comment: beer occ -weekends only    Review of Systems  Genitourinary:       Inguinal hernia.  All other systems reviewed and are negative.   Allergies  Review of patient's allergies indicates no known allergies.  Home Medications   Current Outpatient Rx  Name  Route  Sig  Dispense  Refill  . Menthol-Methyl Salicylate (MUSCLE RUB) 10-15 % CREA   Topical   Apply 1 application topically 3 (three) times daily as needed for muscle pain.         . methylcellulose (ARTIFICIAL  TEARS) 1 % ophthalmic solution   Both Eyes   Place 1 drop into both eyes 3 (three) times daily as needed (dry eyes).         Marland Kitchen oxyCODONE (OXY IR/ROXICODONE) 5 MG immediate release tablet   Oral   Take 1-2 tablets (5-10 mg total) by mouth every 4 (four) hours as needed for moderate pain, severe pain or breakthrough pain.   50 tablet   0   . ranitidine (ZANTAC) 150 MG tablet   Oral   Take 150 mg by mouth daily.         Marland Kitchen triamterene-hydrochlorothiazide (DYAZIDE) 37.5-25 MG per capsule   Oral   Take 1 capsule by mouth at bedtime.          Triage Vitals: BP 149/87  Pulse 83  Temp(Src) 98.1 F (36.7 C)  Resp 18  Ht 6\' 3"  (1.905 m)  Wt 211 lb (95.709 kg)  BMI 26.37 kg/m2  SpO2 94%  Physical Exam  Nursing note and vitals reviewed. Constitutional: He is oriented to person, place, and time. He appears well-developed and well-nourished. No distress.  HENT:  Head: Normocephalic and atraumatic.  Eyes: EOM are normal.  Neck: Neck supple. No tracheal deviation present.  Cardiovascular: Normal rate.  Pulmonary/Chest: Effort normal. No respiratory distress.  Abdominal: He exhibits distension (mild).  Genitourinary:  Large right inguinal hernia going down to scrotum. Not reducible. There is mild tenderness.  Musculoskeletal: Normal range of motion.  Neurological: He is alert and oriented to person, place, and time.  Skin: Skin is warm and dry.  Psychiatric: He has a normal mood and affect. His behavior is normal.    ED Course  Procedures (including critical care time)  DIAGNOSTIC STUDIES: Oxygen Saturation is 94% on RA, adequate by my interpretation.    COORDINATION OF CARE: 7:00 PM- Discussed plan to consult General Surgery. Pt advised of plan for treatment and pt agrees.  7:47 PM- Tried to reduce pt's hernia twice without any success.  Labs Review Labs Reviewed - No data to display Imaging Review No results found.  EKG Interpretation   None       MDM    Final diagnoses:  Post-operative complication    Patient with right inguinal to right groin swelling after a laparoscopic hernia repair earlier today. No signs of obstruction. No ecchymosis. Unable to reduce. Minimal pain. Discussed with Dr. Barkley Bruns. Thinks more likely air versus fluid as opposed to recurrence of the hernia. Does not recommend CT at this time. He is not obstructed. He recommends calling the office tomorrow and icing tonight. Patient will return for signs of obstruction.   I personally performed the services described in this documentation, which was scribed in my presence. The recorded information has been reviewed and is accurate.     Jasper Riling. Alvino Chapel, MD 03/17/13 2102  Abdomen and mass has become more painfull and swollen will get CT.   Jasper Riling. Alvino Chapel, MD 03/17/13 2114

## 2013-03-17 NOTE — ED Notes (Addendum)
Patient states that he is having some mild pain. Asking for ice pack and something for pain. Drinking contrast. Patient's BP is slightly elevated.

## 2013-03-17 NOTE — Op Note (Signed)
03/17/2013  12:08 PM  PATIENT:  Trevor Rangel  63 y.o. male  Patient Care Team: [name]. [name] as PCP - General (Nurse Practitioner)  PRE-OPERATIVE DIAGNOSIS:    Right inguina hernia, possible recurrent left inguinal  POST-OPERATIVE DIAGNOSIS:    RIGHT INGUINAL HERNIA LEFT INGUINAL HERNIA  PROCEDURE:  Procedure(s): LAPAROSCOPIC EXPLORATION AND REPAIR OF BILATERAL INGUINAL HERNIAS INSERTION OF MESH  SURGEON:  Surgeon(s): [name], MD  ASSISTANT: RN FA   ANESTHESIA:   local and general  EBL:     Delay start of Pharmacological VTE agent (>24hrs) due to surgical blood loss or risk of bleeding:  no  DRAINS: none   SPECIMEN:  No Specimen  DISPOSITION OF SPECIMEN:  N/A  COUNTS:  YES  PLAN OF CARE: Discharge to home after PACU  PATIENT DISPOSITION:  PACU - hemodynamically stable.  INDICATION:   The anatomy & physiology of the abdominal wall and pelvic floor was discussed.  The pathophysiology of hernias in the inguinal and pelvic region was discussed.  Natural history risks such as progressive enlargement, pain, incarceration & strangulation was discussed.   Contributors to complications such as smoking, obesity, diabetes, prior surgery, etc were discussed.    I feel the risks of no intervention will lead to serious problems that outweigh the operative risks; therefore, I recommended surgery to reduce and repair the hernia.  I explained laparoscopic techniques with possible need for an open approach.  I noted usual use of mesh to patch and/or buttress hernia repair  Risks such as bleeding, infection, abscess, need for further treatment, heart attack, death, and other risks were discussed.  I noted a good likelihood this will help address the problem.   Goals of post-operative recovery were discussed as well.  Possibility that this will not correct all symptoms was explained.  I stressed the importance of low-impact activity, aggressive pain control, avoiding  constipation, & not pushing through pain to minimize risk of post-operative chronic pain or injury. Possibility of reherniation was discussed.  We will work to minimize complications.     An educational handout further explaining the pathology & treatment options was given as well.  Questions were answered.  The patient expresses understanding & wishes to proceed with surgery.  OR FINDINGS: Large indirect hernia on the right side.  Indirect repair of left-sided intact.  A small direct recurrence medial.  No evidence of femoral or obturator significant hernias.  15 x 15 cm ultra Pro mesh for each side.  No tacks.  DESCRIPTION:   The patient was identified & brought into the operating room. The patient was positioned supine with arms tucked. SCDs were active during the entire case. The patient underwent general anesthesia without any difficulty.  The abdomen was prepped and draped in a sterile fashion. The patient's bladder was emptied.  A Surgical Timeout confirmed our plan.  I made a transverse incision through the inferior umbilical fold.  I made a small transverse nick through the anterior rectus fascia contralateral to the inguinal hernia side and placed a 0-vicryl stitch through the fascia.  I placed a Hasson trocar into the preperitoneal plane.  Entry was clean.  We induced carbon dioxide insufflation. Camera inspection revealed no injury.  I used a 78mm angled scope to bluntly free the peritoneum off the infraumbilical anterior abdominal wall.  I created enough of a preperitoneal pocket to place 75mm ports into the right & left mid-abdomen into this preperitoneal cavity.  I focused attention on the right  side since that was the dominant hernia side.   I used blunt & focused sharp dissection to free the peritoneum off the flank and down to the pubic rim.  I freed the anteriolateral bladder wall off the anteriolateral pelvic wall, sparing midline attachments.   I located a swath of peritoneum  going into a hernia fascial defect at the internal ring consistent with an indirect hernia.  I gradually freed the peritoneal hernia sac off safely and reduced it into the preperitoneal space.  I freed the peritoneum off the spermatic vessels & vas deferens.  I freed peritoneum off the retroperitoneum along the psoas muscle.    I checked & assured hemostasis.    I turned attention on the opposite side.  I did dissection in a similar, mirror-image fashion.   He had some adhesions to the prior open repair.  The internal ring was well covered.  The patient had a small direct space hernia.      In freeing off the hernia sacs I did have a small breech in the peritoneum at the left direct space hernia.  I closed that using absorbable Vicryl stitch using laparoscopic intracorporeal suturing.  There were no breaches in peritoneum that required closure.  I chose 15x15 cm sheets of ultra-lightweight polypropylene mesh (Ultrapro), one for each side.  I cut a single sigmoid-shaped slit ~6cm from a corner of each mesh.  I placed the meshes into the preperitoneal space & laid them as overlapping diamonds such that at the inferior points, a 6x6 cm corner flap rested in the true anterolateral pelvis, covering the obturator & femoral foramina.   I allowed the bladder to fall back and help tuck the corners of the mesh in.  The medial corners overlapped each other across midline cephalad to the pubic rim.   This provided >2 inch coverage around the hernia.  Because the defects well covered and not particularly large, I did not need tacks to hold the mesh in place  I held the hernia sacs cephalad & evacuated carbon dioxide.  I closed the fascia  With absorbable suture.  I closed the skin using 4-0 monocryl stitch.  Sterile dressings were applied. The patient was extubated & arrived in the PACU in stable condition..  I had discussed postoperative care with the patient in the holding area.    Instructions are written in the  chart.  No family was here, but I was given a cell phone number [phone].  I called and left a message.

## 2013-03-17 NOTE — Anesthesia Procedure Notes (Signed)
Procedure Name: Intubation Date/Time: 03/17/2013 9:47 AM Performed by: Ofilia Neas Pre-anesthesia Checklist: Patient identified, Patient being monitored, Timeout performed, Emergency Drugs available and Suction available Patient Re-evaluated:Patient Re-evaluated prior to inductionOxygen Delivery Method: Circle system utilized Preoxygenation: Pre-oxygenation with 100% oxygen Intubation Type: IV induction and Cricoid Pressure applied Ventilation: Mask ventilation without difficulty Laryngoscope Size: Mac and 5 Grade View: Grade II Tube type: Oral Tube size: 7.5 mm Number of attempts: 1 Airway Equipment and Method: Stylet Placement Confirmation: ETT inserted through vocal cords under direct vision,  positive ETCO2 and breath sounds checked- equal and bilateral Secured at: 21 cm Tube secured with: Tape Dental Injury: Teeth and Oropharynx as per pre-operative assessment  Difficulty Due To: Difficulty was anticipated Future Recommendations: Recommend- induction with short-acting agent, and alternative techniques readily available Comments: Anticipated difficulty due to prior trache.  No problem passing no. 7 ett.

## 2013-03-17 NOTE — ED Notes (Signed)
Ice pack given

## 2013-03-17 NOTE — ED Notes (Signed)
Pt had hernia repair today at [facility]. Pt c/o bleeding at laparoscopic incision site.

## 2013-03-17 NOTE — Transfer of Care (Signed)
Immediate Anesthesia Transfer of Care Note  Patient: Trevor Rangel  Procedure(s) Performed: Procedure(s): LAPAROSCOPIC EXPLORATION AND REPAIR OF BILATERAL INGUINAL HERNIAS (Bilateral) INSERTION OF MESH (Bilateral)  Patient Location: PACU  Anesthesia Type:General  Level of Consciousness: awake, pateint uncooperative, confused, lethargic and responds to stimulation  Airway & Oxygen Therapy: Patient Spontanous Breathing and Patient connected to face mask oxygen  Post-op Assessment: Report given to PACU RN, Post -op Vital signs reviewed and stable and Patient moving all extremities X 4  Post vital signs: Reviewed and stable  Complications: No apparent anesthesia complications

## 2013-03-18 ENCOUNTER — Telehealth (INDEPENDENT_AMBULATORY_CARE_PROVIDER_SITE_OTHER): Payer: Self-pay

## 2013-03-18 ENCOUNTER — Encounter (HOSPITAL_COMMUNITY): Payer: Self-pay | Admitting: Surgery

## 2013-03-18 ENCOUNTER — Encounter (INDEPENDENT_AMBULATORY_CARE_PROVIDER_SITE_OTHER): Payer: BC Managed Care – PPO | Admitting: Surgery

## 2013-03-18 DIAGNOSIS — Z9889 Other specified postprocedural states: Principal | ICD-10-CM

## 2013-03-18 DIAGNOSIS — Z8719 Personal history of other diseases of the digestive system: Secondary | ICD-10-CM

## 2013-03-18 LAB — BASIC METABOLIC PANEL
BUN: 17 mg/dL (ref 6–23)
CALCIUM: 9 mg/dL (ref 8.4–10.5)
CO2: 26 mEq/L (ref 19–32)
Chloride: 97 mEq/L (ref 96–112)
Creatinine, Ser: 0.84 mg/dL (ref 0.50–1.35)
GFR calc Af Amer: >90 mL/min (ref >90)
GLUCOSE: 105 mg/dL — AB (ref 70–99)
Potassium: 4 mEq/L (ref 3.7–5.3)
SODIUM: 135 meq/L — AB (ref 137–147)

## 2013-03-18 LAB — CBC
HCT: 33.6 % — ABNORMAL LOW (ref 39.0–52.0)
HEMOGLOBIN: 11.5 g/dL — AB (ref 13.0–17.0)
MCH: 30.7 pg (ref 26.0–34.0)
MCHC: 34.2 g/dL (ref 30.0–36.0)
MCV: 89.6 fL (ref 78.0–100.0)
Platelets: 247 10*3/uL (ref 150–400)
RBC: 3.75 MIL/uL — ABNORMAL LOW (ref 4.22–5.81)
RDW: 14.4 % (ref 11.5–15.5)
WBC: 8.7 10*3/uL (ref 4.0–10.5)

## 2013-03-18 MED ORDER — MORPHINE SULFATE 4 MG/ML IJ SOLN
4.0000 mg | Freq: Once | INTRAMUSCULAR | Status: AC
Start: 2013-03-18 — End: 2013-03-18
  Administered 2013-03-18: 4 mg via INTRAVENOUS
  Filled 2013-03-18: qty 1

## 2013-03-18 MED ORDER — IOHEXOL 300 MG/ML  SOLN
100.0000 mL | Freq: Once | INTRAMUSCULAR | Status: AC | PRN
  Administered 2013-03-18: 100 mL via INTRAVENOUS

## 2013-03-18 NOTE — ED Provider Notes (Signed)
D/w Dr. Zella Richer, CBC checked - recommends f/u with Dr. Johney Maine - he will talk with him tomorrow to have him contact pt - pt is in agreement - has pain medicine at home, Hgb >11.     Johnna Acosta, MD 03/18/13 [phone]

## 2013-03-18 NOTE — Telephone Encounter (Signed)
Called pt to check on him after getting a call from Dr Zella Richer that the pt ended up in the [facility] ER last night after having inguinal hernia repair done yesterday by Dr Johney Maine. The pt said he is so uncomfortable with the swelling that he has going on and c/o bruising in his testicles. I advised pt that Dr Zella Richer notified Dr Johney Maine and they looked over the pt's records from last night. I advised pt that they both agree it's a hematoma from surgery and there is nothing really to do for this except just to give it some time per Dr Zella Richer. I advised pt that he should be using an ice pack along with wearing briefs to try and help with the swelling. I advised pt that he is going to see some more bruising around the testicles and penis area over the next few days. I saw where the pt made an appt to come see Dr Harlow Asa in the urgent office today so I explained to the pt that if wanted to cancel the appt we could do so since we just need to give this some time to go down on it's own. The pt states that he is in too much pain that he can't walk b/c of the swelling. I advised pt once again he is going to have some pain and swelling b/c he just had surgery yesterday but if he would feel more comfortable being seen before the weekend then I will leave the appt a lone for today but I want the pt to know that the doctor might tell him the same thing that I just went over with him. I advised pt that we normally don't do anything for hematoma's we like for the body to just resolve this own it's on but the pt stated something had to be done today b/c he can't go on with this pain. I will leave the appt a lone.

## 2013-03-18 NOTE — Discharge Instructions (Signed)
Watch for signs of obstruction, such as nausea vomiting or increasing abdominal size. Ice the area tonight. Return for worsening symptoms.

## 2013-03-18 NOTE — Telephone Encounter (Signed)
No NSAIDs.  No aspirin.  No anticoagulation / blood thinners Tylenol 1g QID Oxycodone for pain Leave the hematoma alone - it will resolve on its own (in 6-12weeks)

## 2013-03-18 NOTE — ED Notes (Signed)
Pt alert & oriented x4, carried out by wheelchair. Patient given discharge instructions, paperwork & prescription(s). Patient verbalized understanding. Pt left department w/ no further questions.

## 2013-03-18 NOTE — Telephone Encounter (Signed)
Patient was seen in the ED last night due groining swelling,advised to be seen . Patient demanding to be seen today appt 03-18-13@330p  in [facility].

## 2013-03-21 NOTE — Telephone Encounter (Signed)
Please call & check on him this AM.  Thanks

## 2013-03-21 NOTE — Telephone Encounter (Signed)
Called pt to check on him from last week having surgery and developing a hematoma. The pt is doing the same with pain. The pt is using ice packs on the hematoma and stated that he thought it was a little better. The pt is taking his pain medicine 2 tabs every 4hrs so he said he might need a refill on the pain medicine soon. I advised pt that if any problems he should call our office. The pt understands.

## 2013-03-22 NOTE — Telephone Encounter (Signed)
He can have refills on narcotics for the next 60 days as needed  Oxy #50

## 2013-03-23 MED ORDER — OXYCODONE HCL 5 MG PO TABS: 5.0000 mg | ORAL_TABLET | ORAL | Status: DC | PRN

## 2013-03-23 NOTE — Addendum Note (Signed)
Addended by: Illene Regulus on: 03/23/2013 03:44 PM   Modules accepted: Orders

## 2013-03-23 NOTE — Telephone Encounter (Signed)
Pt calling for a refill on the Oxycodone. I will print a script and get Dr Excell Seltzer to sign for Dr Johney Maine since he is urgent office doctor today.

## 2013-04-06 ENCOUNTER — Encounter (INDEPENDENT_AMBULATORY_CARE_PROVIDER_SITE_OTHER): Payer: BC Managed Care – PPO | Admitting: Surgery

## 2013-04-08 ENCOUNTER — Ambulatory Visit (INDEPENDENT_AMBULATORY_CARE_PROVIDER_SITE_OTHER): Payer: BC Managed Care – PPO | Admitting: Surgery

## 2013-04-08 ENCOUNTER — Encounter (INDEPENDENT_AMBULATORY_CARE_PROVIDER_SITE_OTHER): Payer: BC Managed Care – PPO | Admitting: Surgery

## 2013-04-08 ENCOUNTER — Encounter (INDEPENDENT_AMBULATORY_CARE_PROVIDER_SITE_OTHER): Payer: Self-pay | Admitting: Surgery

## 2013-04-08 VITALS — BP 134/98 | HR 82 | Temp 98.3°F | Resp 16 | Ht 75.0 in | Wt 206.0 lb

## 2013-04-08 DIAGNOSIS — Z9889 Other specified postprocedural states: Secondary | ICD-10-CM

## 2013-04-08 DIAGNOSIS — K4020 Bilateral inguinal hernia, without obstruction or gangrene, not specified as recurrent: Secondary | ICD-10-CM

## 2013-04-08 DIAGNOSIS — S3012XA Contusion of groin, initial encounter: Secondary | ICD-10-CM

## 2013-04-08 DIAGNOSIS — S301XXA Contusion of abdominal wall, initial encounter: Secondary | ICD-10-CM

## 2013-04-08 DIAGNOSIS — Z8719 Personal history of other diseases of the digestive system: Secondary | ICD-10-CM

## 2013-04-08 MED ORDER — METHOCARBAMOL 750 MG PO TABS: 750.0000 mg | ORAL_TABLET | Freq: Four times a day (QID) | ORAL | Status: DC | PRN

## 2013-04-08 MED ORDER — OXYCODONE HCL 5 MG PO TABS: 5.0000 mg | ORAL_TABLET | ORAL | Status: DC | PRN

## 2013-04-08 NOTE — Progress Notes (Signed)
Subjective:     Patient ID: Trevor Rangel, male   DOB: 1950-08-19, 63 y.o.   MRN: [id number]  HPI  Note: This dictation was prepared with Dragon/digital dictation along with [facility] technology. Any transcriptional errors that result from this process are unintentional.       Trevor Rangel  1951-01-21 [id number]  Patient Care Team: [name]. [name] as PCP - General (Nurse Practitioner)  Procedure (Date: 03/17/2013):  POST-OPERATIVE DIAGNOSIS:  RIGHT INGUINAL HERNIA  LEFT INGUINAL HERNIA   PROCEDURE: Procedure(s):  LAPAROSCOPIC EXPLORATION AND REPAIR OF BILATERAL INGUINAL HERNIAS  INSERTION OF MESH   SURGEON:  [name], MD  This patient returns for surgical re-evaluation.  Has been almost a month since surgery.  He started with pain and swelling early.  Recommended ice packs and avoiding nonsteroidals.  Refilled narcotic pain medicines.  Offered to see him sooner.  Have not heard from him in weeks.  He comes in feeling still very sore and swollen.  His wife is with him.  They are concerned.  He was taking oxycodone every 4 hours.  He is now down to 2-3 times a day.  Trying icepack but it does not help.  Urinating okay.  Still some urinary urgency.  No fevers or chills or sweats.  Moving bowels okay.  Appetite is okay.  Seems down hearted.  Patient Active Problem List   Diagnosis Date Noted  . Bilateral inguinal hernia (BIH) s/p lap repair w mesh 03/17/2013 12/15/2012  . BPH (benign prostatic hyperplasia)     Past Medical History  Diagnosis Date  . Hypertension   . DDD (degenerative disc disease), lumbar   . Sciatica   . BPH (benign prostatic hyperplasia)     Past Surgical History  Procedure Laterality Date  . Inguinal hernia repair Left 05/04/2009    Dr Rise Patience  . Colonoscopy  ~2004    "Normal" per patient  . Inguinal hernia repair Bilateral 03/17/2013    Procedure: LAPAROSCOPIC EXPLORATION AND REPAIR OF BILATERAL INGUINAL HERNIAS;  Surgeon: [name],  MD;  Location: [facility];  Service: General;  Laterality: Bilateral;  . Insertion of mesh Bilateral 03/17/2013    Procedure: INSERTION OF MESH;  Surgeon: [name], MD;  Location: [facility];  Service: General;  Laterality: Bilateral;    History   Social History  . Marital Status: Single    Spouse Name: N/A    Number of Children: N/A  . Years of Education: N/A   Occupational History  . Not on file.   Social History Main Topics  . Smoking status: Former Smoker    Types: Cigarettes    Quit date: 03/10/2010  . Smokeless tobacco: Never Used  . Alcohol Use: Yes     Comment: beer occ -weekends only  . Drug Use: No  . Sexual Activity: Yes   Other Topics Concern  . Not on file   Social History Narrative  . No narrative on file    Family History  Problem Relation Age of Onset  . Heart disease Mother   . Cancer Father     prostate    Current Outpatient Prescriptions  Medication Sig Dispense Refill  . Menthol-Methyl Salicylate (MUSCLE RUB) 10-15 % CREA Apply 1 application topically 3 (three) times daily as needed for muscle pain.      . methylcellulose (ARTIFICIAL TEARS) 1 % ophthalmic solution Place 1 drop into both eyes 3 (three) times daily as needed (dry eyes).      Marland Kitchen  oxyCODONE (OXY IR/ROXICODONE) 5 MG immediate release tablet Take 1-2 tablets (5-10 mg total) by mouth every 4 (four) hours as needed for moderate pain, severe pain or breakthrough pain.  50 tablet  0  . ranitidine (ZANTAC) 150 MG tablet Take 150 mg by mouth daily.      Marland Kitchen triamterene-hydrochlorothiazide (DYAZIDE) 37.5-25 MG per capsule Take 1 capsule by mouth at bedtime.       No current facility-administered medications for this visit.     No Known Allergies  BP 134/98  Pulse 82  Temp(Src) 98.3 F (36.8 C) (Oral)  Resp 16  Ht 6\' 3"  (1.905 m)  Wt 206 lb (93.441 kg)  BMI 25.75 kg/m2  Dg Chest 2 View  03/10/2013   CLINICAL DATA:  63 year old male preoperative study for hernia repair. Hypertension.  Initial encounter.  EXAM: CHEST  2 VIEW  COMPARISON:  02/03/2009.  FINDINGS: Lung volumes remain normal. Cardiac size at the upper limits of normal. Other mediastinal contours are within normal limits. No pneumothorax, pulmonary edema pleural effusion or confluent pulmonary opacity. No acute osseous abnormality identified.  IMPRESSION: No acute cardiopulmonary abnormality.   Electronically Signed   By: Lars Pinks M.D.   On: 03/10/2013 13:49   Ct Abdomen Pelvis W Contrast  03/18/2013   CLINICAL DATA:  Hernia repair 03/17/2013. No pain and swelling in lower abdomen and testicles.  EXAM: CT ABDOMEN AND PELVIS WITH CONTRAST  TECHNIQUE: Multidetector CT imaging of the abdomen and pelvis was performed using the standard protocol following bolus administration of intravenous contrast.  CONTRAST:  44mL OMNIPAQUE IOHEXOL 300 MG/ML SOLN, 176mL OMNIPAQUE IOHEXOL 300 MG/ML SOLN  COMPARISON:  None.  FINDINGS: There is mild atelectasis at the right lung base. Benign appearing cystic lesions in the lower lobes are thin-walled. No pericardial fluid.  There is gas dissecting through the left in right abdominal wall musculature related to recent surgery. Gas extends from the left and right hemiscrotum.  There is a large hematoma within the right inguinal canal measuring 6.7 x 7.4 by approximately 11.0 cm. This hematoma extends into the upper aspect of the right hemiscrotum. Gas dissects along the base the penis in into the skin of the penis.  Small amount intraperitoneal free air is also noted seen of over the margins of the liver. = The gallbladder, pancreas, spleen, adrenal glands, kidneys are normal.  The stomach, small bowel or and colon are unremarkable.  Abdominal aorta is normal caliber. No retroperitoneal periportal lymphadenopathy.  No significant free fluid the pelvis. The bladder and prostate gland are normal. The several vesicles are normal. No aggressive osseous lesion.  IMPRESSION: 1. Large hematoma within the right  inguinal canal extends into the right hemiscrotum. 2. Extensive subcutaneous gas centered within the left and right hemiscrotum and dissecting into the base of the penis and along the left or right abdominal wall musculature of level of the sternum. 3. Small intraperitoneal free air.   Electronically Signed   By: Suzy Bouchard M.D.   On: 03/18/2013 00:42     Review of Systems  Constitutional: Negative for fever, chills and diaphoresis.  HENT: Negative for sore throat and trouble swallowing.   Eyes: Negative for photophobia and visual disturbance.  Respiratory: Negative for choking and shortness of breath.   Cardiovascular: Negative for chest pain and palpitations.  Gastrointestinal: Negative for nausea, vomiting, abdominal distention, anal bleeding and rectal pain.  Genitourinary: Positive for scrotal swelling. Negative for dysuria, urgency, difficulty urinating and testicular pain.  Musculoskeletal: Negative  for arthralgias, gait problem, myalgias and neck pain.  Skin: Negative for color change and rash.  Neurological: Negative for dizziness, speech difficulty, weakness and numbness.  Hematological: Negative for adenopathy.  Psychiatric/Behavioral: Negative for hallucinations, confusion and agitation.       Objective:   Physical Exam  Constitutional: He is oriented to person, place, and time. He appears well-developed and well-nourished. No distress.  HENT:  Head: Normocephalic.  Mouth/Throat: Oropharynx is clear and moist. No oropharyngeal exudate.  Eyes: Conjunctivae and EOM are normal. Pupils are equal, round, and reactive to light. No scleral icterus.  Neck: Normal range of motion. No tracheal deviation present.  Cardiovascular: Normal rate, normal heart sounds and intact distal pulses.   Pulmonary/Chest: Effort normal. No respiratory distress.  Abdominal: Soft. He exhibits no distension. There is no tenderness. Hernia confirmed negative in the right inguinal area and confirmed  negative in the left inguinal area.  Incisions clean with normal healing ridges.  No hernias  Genitourinary:     Musculoskeletal: Normal range of motion. He exhibits no tenderness.  Neurological: He is alert and oriented to person, place, and time. No cranial nerve deficit. He exhibits normal muscle tone. Coordination normal.  Skin: Skin is warm and dry. No rash noted. He is not diaphoretic.  Psychiatric: He has a normal mood and affect. His behavior is normal.       Assessment:     Status post repair of bilateral hernias with giant right hernia resulting in giant right scrotal hematoma.     Plan:     I am disappointed he had such a large hematoma.  However, he did have large hernia sac.  I tried to reassure him that this will heal with time.  Safest thing is to avoid any surgery or needle aspirations as it could cause new bleeding or infection.  He seemed frustrated with that idea.  I noted the safest thing to do is to let his body heal this as the months ago by.  I warned him this would take a while, at least 3-6 months.  It should improve.  His pain has gone down.  I think that is hopeful sign.  I offered for him to get a second opinion.  (Actually ran it by Dr. Excell Seltzer, partner, who agrees with observation only).  By the end of the visit, they felt confident entrusting my plan.  They seemed more reassured.  Increase activity as tolerated to regular activity.  Low impact exercise such as walking an hour a day at least ideal.  Do not push through pain.    Improve pain control.  Switch to Heat since ice not helping,  Tylenol around-the-clock.  Oxycodone as needed.   Avoid NSAIDs that will encourage bleeding.  The lesser needs that is helpful sign.  Robaxin for when necessary for  pulling irritation.  Hold off on amitriptyline for now.  Diet as tolerated.  Low fat high fiber diet ideal.  Bowel regimen with 30 g fiber a day and fiber supplement as needed to avoid problems.  Return to  clinic q3-4 weeks to follow the hematoma.   Instructions discussed.  Followup with primary care physician for other health issues as would normally be done.  Consider screening for malignancies (breast, prostate, colon, melanoma, etc) as appropriate.  Questions answered.  The patient & his wife expressed understanding and appreciation

## 2013-04-08 NOTE — Patient Instructions (Signed)
Scrotal Hematoma Scrotal hematoma is the collection of blood inside the sac (scrotum) that contains the testicles, the blood vessels that serve the testicles, and the structures that help deliver sperm and semen. The rich blood supply in this area makes it easy for a significant amount of blood to collect in the scrotum even after a minor injury or a small operation like a vasectomy. In addition, the tissues in the scrotum are very loose. There is no pressure from the surrounding tissue to help stop bleeding once it starts.  HOME CARE INSTRUCTIONS  Monitor your hematoma for any changes. The following actions may help to alleviate any discomfort you are experiencing:  Try heating pad/warm baths for 30 minutes, 4-6 times a day  Use scrotal support.  Take appropriate pain medications prescribed by your health care provider.  Minimize physical activity and avoid any activities that would place direct pressure on the scrotum and penis (such as bicycling and horseback riding).  Avoid sexual activity until advised otherwise by your health care provider.  If your health care provider has given you a follow-up appointment, it is very important to keep that appointment. SEEK MEDICAL CARE IF:  You have cloudy or dark urine.  You have frequent urination.  You develop scrotal swelling that does not improve as expected after 4 days. SEEK IMMEDIATE MEDICAL CARE IF:  You develop recurrent or severe pain that is not controlled by prescribed medicine.  You have nausea or vomiting.  There is increased swelling of the scrotum.  You have increased abdominal pain.  You have a fever or chills.  You have difficulty starting urination.  You have painful urination.  Your urine flow is slow.  There is blood in your urine.  You are unable to urinate.  You experience redness spreading from your scrotum into your groin or thighs. MAKE SURE YOU:  Understand these instructions.  Will watch your  condition.  Will get help right away if you are not doing well or get worse. Document Released: 04/14/2006 Document Revised: 09/15/2012 Document Reviewed: 06/17/2012 [facility] Patient Information 2014 [place].  Managing Pain  Pain after surgery or related to activity is often due to strain/injury to muscle, tendon, nerves and/or incisions.  This pain is usually short-term and will improve in a few months.   Many people find it helpful to do the following things TOGETHER to help speed the process of healing and to get back to regular activity more quickly:  1. Avoid heavy physical activity a.  no lifting greater than 20 pounds b. Do not "push through" the pain.  Listen to your body and avoid positions and maneuvers than reproduce the pain c. Walking is okay as tolerated, but go slowly and stop when getting sore.  d. Remember: If it hurts to do it, then don't do it! 2. Take Anti-inflammatory medication  a. Take with food/snack around the clock for 1-2 weeks i. This helps the muscle and nerve tissues become less irritable and calm down faster ii. Choose Acetaminophen 500mg  tabs (Tylenol) 2 pills with every meal and just before bedtime 3. Use a Heating pad or Ice/Cold Pack a. 4-6 times a day b. May use warm bath/hottub  or showers 4. Try Gentle Massage and/or Stretching  a. at the area of pain many times a day b. stop if you feel pain - do not overdo it  Try these steps together to help you body heal faster and avoid making things get worse.  Doing just one  of these things may not be enough.    If you are not getting better after two weeks or are noticing you are getting worse, contact our office for further advice; we may need to re-evaluate you & see what other things we can do to help.  HERNIA REPAIR: POST OP INSTRUCTIONS  1. DIET: Follow a light bland diet the first 24 hours after arrival home, such as soup, liquids, crackers, etc.  Be sure to include lots of fluids daily.   Avoid fast food or heavy meals as your are more likely to get nauseated.  Eat a low fat the next few days after surgery. 2. Take your usually prescribed home medications unless otherwise directed. 3. PAIN CONTROL: a. Pain is best controlled by a usual combination of three different methods TOGETHER: i. Ice/Heat ii. Over the counter pain medication iii. Prescription pain medication b. Most patients will experience some swelling and bruising around the hernia(s) such as the bellybutton, groins, or old incisions.  Ice packs or heating pads (30-60 minutes up to 6 times a day) will help. Use ice for the first few days to help decrease swelling and bruising, then switch to heat to help relax tight/sore spots and speed recovery.  Some people prefer to use ice alone, heat alone, alternating between ice & heat.  Experiment to what works for you.  Swelling and bruising can take several weeks to resolve.   c. It is helpful to take an over-the-counter pain medication regularly for the first few weeks.  Choose one of the following that works best for you: i. Naproxen (Aleve, etc)  Two 220mg  tabs twice a day ii. Ibuprofen (Advil, etc) Three 200mg  tabs four times a day (every meal & bedtime) iii. Acetaminophen (Tylenol, etc) 325-650mg  four times a day (every meal & bedtime) d. A  prescription for pain medication should be given to you upon discharge.  Take your pain medication as prescribed.  i. If you are having problems/concerns with the prescription medicine (does not control pain, nausea, vomiting, rash, itching, etc), please call us [phone] to see if we need to switch you to a different pain medicine that will work better for you and/or control your side effect better. ii. If you need a refill on your pain medication, please contact your pharmacy.  They will contact our office to request authorization. Prescriptions will not be filled after 5 pm or on week-ends. 4. Avoid getting constipated.  Between the  surgery and the pain medications, it is common to experience some constipation.  Increasing fluid intake and taking a fiber supplement (such as Metamucil, Citrucel, FiberCon, MiraLax, etc) 1-2 times a day regularly will usually help prevent this problem from occurring.  A mild laxative (prune juice, Milk of Magnesia, MiraLax, etc) should be taken according to package directions if there are no bowel movements after 48 hours.   5. Wash / shower every day.  You may shower over the dressings as they are waterproof.   6. Remove your waterproof bandages 5 days after surgery.  You may leave the incision open to air.  You may replace a dressing/Band-Aid to cover the incision for comfort if you wish.  Continue to shower over incision(s) after the dressing is off.    7. ACTIVITIES as tolerated:   a. You may resume regular (light) daily activities beginning the next day-such as daily self-care, walking, climbing stairs-gradually increasing activities as tolerated.  If you can walk 30 minutes without difficulty, it is safe  to try more intense activity such as jogging, treadmill, bicycling, low-impact aerobics, swimming, etc. b. Save the most intensive and strenuous activity for last such as sit-ups, heavy lifting, contact sports, etc  Refrain from any heavy lifting or straining until you are off narcotics for pain control.   c. DO NOT PUSH THROUGH PAIN.  Let pain be your guide: If it hurts to do something, don't do it.  Pain is your body warning you to avoid that activity for another week until the pain goes down. d. You may drive when you are no longer taking prescription pain medication, you can comfortably wear a seatbelt, and you can safely maneuver your car and apply brakes. e. Dennis Bast may have sexual intercourse when it is comfortable.  8. FOLLOW UP in our office a. Please call [facility] at [phone] to set up an appointment to see your surgeon in the office for a follow-up appointment approximately 2-3 weeks  after your surgery. b. Make sure that you call for this appointment the day you arrive home to insure a convenient appointment time. 9.  IF YOU HAVE DISABILITY OR FAMILY LEAVE FORMS, BRING THEM TO THE OFFICE FOR PROCESSING.  DO NOT GIVE THEM TO YOUR DOCTOR.  WHEN TO CALL us [phone]: 1. Poor pain control 2. Reactions / problems with new medications (rash/itching, nausea, etc)  3. Fever over 101.5 F (38.5 C) 4. Inability to urinate 5. Nausea and/or vomiting 6. Worsening swelling or bruising 7. Continued bleeding from incision. 8. Increased pain, redness, or drainage from the incision   The clinic staff is available to answer your questions during regular business hours (8:30am-5pm).  Please don't hesitate to call and ask to speak to one of our nurses for clinical concerns.   If you have a medical emergency, go to the nearest emergency room or call 911.  A surgeon from [facility] is always on call at the hospitals in [facility], [place], [place], [place], [place]  [address] ?  [address] [address], [place], [place]   [address] MAIN: [phone] ? TOLL FREE: [phone] ? FAX: [phone] [email or web address]

## 2013-06-06 ENCOUNTER — Ambulatory Visit (INDEPENDENT_AMBULATORY_CARE_PROVIDER_SITE_OTHER): Payer: BC Managed Care – PPO | Admitting: Surgery

## 2013-06-06 ENCOUNTER — Encounter (INDEPENDENT_AMBULATORY_CARE_PROVIDER_SITE_OTHER): Payer: Self-pay | Admitting: Surgery

## 2013-06-06 VITALS — BP 142/90 | HR 74 | Temp 97.4°F | Resp 14 | Ht 75.0 in | Wt 213.6 lb

## 2013-06-06 DIAGNOSIS — K4020 Bilateral inguinal hernia, without obstruction or gangrene, not specified as recurrent: Secondary | ICD-10-CM

## 2013-06-06 DIAGNOSIS — S301XXA Contusion of abdominal wall, initial encounter: Secondary | ICD-10-CM

## 2013-06-06 NOTE — Progress Notes (Signed)
Subjective:     Patient ID: Trevor Rangel, male   DOB: Jun 04, 1950, 63 y.o.   MRN: [id number]  HPI   Note: This dictation was prepared with Dragon/digital dictation along with [facility] technology. Any transcriptional errors that result from this process are unintentional.       Cosmo SHEP PORTER  Sep 01, 1950 [id number]  Patient Care Team: [name]. [name], FNP as PCP - General (Nurse Practitioner)  Procedure (Date: 03/17/2013):  POST-OPERATIVE DIAGNOSIS:  RIGHT INGUINAL HERNIA  LEFT INGUINAL HERNIA   PROCEDURE: Procedure(s):  LAPAROSCOPIC EXPLORATION AND REPAIR OF BILATERAL INGUINAL HERNIAS  INSERTION OF MESH   SURGEON:  [name], MD  This patient returns for surgical re-evaluation.  He started with pain and swelling early.  Recommended ice packs and avoiding nonsteroidals.  Refilled narcotic pain medicines.  Offered to see him sooner.  Have not heard from him in weeks.  Finally came one month later with large right groin hematoma.  Noted it would take months to resolve and recommended followup.  He comes now over a month later.  He is glad that the swelling has gone down.  He thinks the pain medicines make it go away.  He wants to get back to work.  However he is afraid he will make things worse if he is active.  Testicle still rather sensitive especially with walking and activity.  Trying to get out to exercise more.  No fevers or chills.  Patient Active Problem List   Diagnosis Date Noted  . Groin hematoma - right 04/08/2013  . Bilateral inguinal hernia (BIH) s/p lap repair w mesh 03/17/2013 12/15/2012  . BPH (benign prostatic hyperplasia)     Past Medical History  Diagnosis Date  . Hypertension   . DDD (degenerative disc disease), lumbar   . Sciatica   . BPH (benign prostatic hyperplasia)     Past Surgical History  Procedure Laterality Date  . Inguinal hernia repair Left 05/04/2009    Dr Rise Patience  . Colonoscopy  ~2004    "Normal" per patient  . Inguinal  hernia repair Bilateral 03/17/2013    Procedure: LAPAROSCOPIC EXPLORATION AND REPAIR OF BILATERAL INGUINAL HERNIAS;  Surgeon: [name], MD;  Location: [facility];  Service: General;  Laterality: Bilateral;  . Insertion of mesh Bilateral 03/17/2013    Procedure: INSERTION OF MESH;  Surgeon: [name], MD;  Location: [facility];  Service: General;  Laterality: Bilateral;    History   Social History  . Marital Status: Single    Spouse Name: N/A    Number of Children: N/A  . Years of Education: N/A   Occupational History  . Not on file.   Social History Main Topics  . Smoking status: Former Smoker    Types: Cigarettes    Quit date: 03/10/2010  . Smokeless tobacco: Never Used  . Alcohol Use: Yes     Comment: beer occ -weekends only  . Drug Use: No  . Sexual Activity: Yes   Other Topics Concern  . Not on file   Social History Narrative  . No narrative on file    Family History  Problem Relation Age of Onset  . Heart disease Mother   . Cancer Father     prostate    Current Outpatient Prescriptions  Medication Sig Dispense Refill  . amLODipine (NORVASC) 10 MG tablet       . furosemide (LASIX) 20 MG tablet       . hydrALAZINE (APRESOLINE) 25 MG tablet       .  Menthol-Methyl Salicylate (MUSCLE RUB) 10-15 % CREA Apply 1 application topically 3 (three) times daily as needed for muscle pain.      . methocarbamol (ROBAXIN) 750 MG tablet Take 1 tablet (750 mg total) by mouth 4 (four) times daily as needed (use for muscle cramps/pain).  30 tablet  2  . methylcellulose (ARTIFICIAL TEARS) 1 % ophthalmic solution Place 1 drop into both eyes 3 (three) times daily as needed (dry eyes).      . ranitidine (ZANTAC) 150 MG tablet Take 150 mg by mouth daily.      . simvastatin (ZOCOR) 20 MG tablet       . triamterene-hydrochlorothiazide (DYAZIDE) 37.5-25 MG per capsule Take 1 capsule by mouth at bedtime.       No current facility-administered medications for this visit.     No Known  Allergies  BP 142/90  Pulse 74  Temp(Src) 97.4 F (36.3 C) (Temporal)  Resp 14  Ht 6\' 3"  (1.905 m)  Wt 213 lb 9.6 oz (96.888 kg)  BMI 26.70 kg/m2  Dg Chest 2 View  03/10/2013   CLINICAL DATA:  63 year old male preoperative study for hernia repair. Hypertension. Initial encounter.  EXAM: CHEST  2 VIEW  COMPARISON:  02/03/2009.  FINDINGS: Lung volumes remain normal. Cardiac size at the upper limits of normal. Other mediastinal contours are within normal limits. No pneumothorax, pulmonary edema pleural effusion or confluent pulmonary opacity. No acute osseous abnormality identified.  IMPRESSION: No acute cardiopulmonary abnormality.   Electronically Signed   By: Lars Pinks M.D.   On: 03/10/2013 13:49   Ct Abdomen Pelvis W Contrast  03/18/2013   CLINICAL DATA:  Hernia repair 03/17/2013. No pain and swelling in lower abdomen and testicles.  EXAM: CT ABDOMEN AND PELVIS WITH CONTRAST  TECHNIQUE: Multidetector CT imaging of the abdomen and pelvis was performed using the standard protocol following bolus administration of intravenous contrast.  CONTRAST:  35mL OMNIPAQUE IOHEXOL 300 MG/ML SOLN, 134mL OMNIPAQUE IOHEXOL 300 MG/ML SOLN  COMPARISON:  None.  FINDINGS: There is mild atelectasis at the right lung base. Benign appearing cystic lesions in the lower lobes are thin-walled. No pericardial fluid.  There is gas dissecting through the left in right abdominal wall musculature related to recent surgery. Gas extends from the left and right hemiscrotum.  There is a large hematoma within the right inguinal canal measuring 6.7 x 7.4 by approximately 11.0 cm. This hematoma extends into the upper aspect of the right hemiscrotum. Gas dissects along the base the penis in into the skin of the penis.  Small amount intraperitoneal free air is also noted seen of over the margins of the liver. = The gallbladder, pancreas, spleen, adrenal glands, kidneys are normal.  The stomach, small bowel or and colon are unremarkable.   Abdominal aorta is normal caliber. No retroperitoneal periportal lymphadenopathy.  No significant free fluid the pelvis. The bladder and prostate gland are normal. The several vesicles are normal. No aggressive osseous lesion.  IMPRESSION: 1. Large hematoma within the right inguinal canal extends into the right hemiscrotum. 2. Extensive subcutaneous gas centered within the left and right hemiscrotum and dissecting into the base of the penis and along the left or right abdominal wall musculature of level of the sternum. 3. Small intraperitoneal free air.   Electronically Signed   By: Suzy Bouchard M.D.   On: 03/18/2013 00:42     Review of Systems  Constitutional: Negative for fever, chills and diaphoresis.  HENT: Negative for sore throat and  trouble swallowing.   Eyes: Negative for photophobia and visual disturbance.  Respiratory: Negative for choking and shortness of breath.   Cardiovascular: Negative for chest pain and palpitations.  Gastrointestinal: Negative for nausea, vomiting, abdominal distention, anal bleeding and rectal pain.  Genitourinary: Positive for scrotal swelling. Negative for dysuria, urgency, difficulty urinating and testicular pain.  Musculoskeletal: Negative for arthralgias, gait problem, myalgias and neck pain.  Skin: Negative for color change and rash.  Neurological: Negative for dizziness, speech difficulty, weakness and numbness.  Hematological: Negative for adenopathy.  Psychiatric/Behavioral: Negative for hallucinations, confusion and agitation.       Objective:   Physical Exam  Constitutional: He is oriented to person, place, and time. He appears well-developed and well-nourished. No distress.  HENT:  Head: Normocephalic.  Mouth/Throat: Oropharynx is clear and moist. No oropharyngeal exudate.  Eyes: Conjunctivae and EOM are normal. Pupils are equal, round, and reactive to light. No scleral icterus.  Neck: Normal range of motion. No tracheal deviation present.   Cardiovascular: Normal rate, normal heart sounds and intact distal pulses.   Pulmonary/Chest: Effort normal. No respiratory distress.  Abdominal: Soft. He exhibits no distension. There is no tenderness. Hernia confirmed negative in the right inguinal area and confirmed negative in the left inguinal area.  Incisions clean with normal healing ridges.  No hernias  Genitourinary:     Musculoskeletal: Normal range of motion. He exhibits no tenderness.  Neurological: He is alert and oriented to person, place, and time. No cranial nerve deficit. He exhibits normal muscle tone. Coordination normal.  Skin: Skin is warm and dry. No rash noted. He is not diaphoretic.  Psychiatric: He has a normal mood and affect. His behavior is normal.       Assessment:     Status post repair of bilateral hernias with giant right hernia resulting in giant right scrotal hematoma - resolving.     Plan:     I tried to reassure him that this will continue to heal with time.  Safest thing is to avoid any surgery or needle aspirations as it could cause new bleeding or infection.  I noted the safest thing to do is to let his body heal this as the months ago by.  I warned him this would take a while, at least 3-6 months.  It will  improve.   Increase activity as tolerated to regular activity.  Low impact exercise such as walking an hour a day at least ideal.  Do not push through pain.  He is afraid to go back to work as he has a lot of heavy lifting.  Reasonable to wait a few more weeks but not indefinitely.  Pain control.  Heat/Tylenol around-the-clock.  No more oxycodone needed.     Diet as tolerated.  Low fat high fiber diet ideal.  Bowel regimen with 30 g fiber a day and fiber supplement as needed to avoid problems.  Return to clinic 6 weeks to follow the hematoma.   Instructions discussed.  Followup with primary care physician for other health issues as would normally be done.  Consider screening for malignancies  (breast, prostate, colon, melanoma, etc) as appropriate.  Questions answered.  The patient expressed understanding and appreciation

## 2013-06-06 NOTE — Patient Instructions (Signed)
Please consider the recommendations that we have given you today:  Increase activity as tolerated.  He will push to pain.  Okay to return to work in the next few weeks when soreness down.  See the Handout(s) we have given you.  Please call our office at [phone] if you wish to schedule surgery or if you have further questions / concerns.    HERNIA REPAIR: POST OP INSTRUCTIONS  1. DIET: Follow a light bland diet the first 24 hours after arrival home, such as soup, liquids, crackers, etc.  Be sure to include lots of fluids daily.  Avoid fast food or heavy meals as your are more likely to get nauseated.  Eat a low fat the next few days after surgery. 2. Take your usually prescribed home medications unless otherwise directed. 3. PAIN CONTROL: a. Pain is best controlled by a usual combination of three different methods TOGETHER: i. Ice/Heat ii. Over the counter pain medication iii. Prescription pain medication b. Most patients will experience some swelling and bruising around the hernia(s) such as the bellybutton, groins, or old incisions.  Ice packs or heating pads (30-60 minutes up to 6 times a day) will help. Use ice for the first few days to help decrease swelling and bruising, then switch to heat to help relax tight/sore spots and speed recovery.  Some people prefer to use ice alone, heat alone, alternating between ice & heat.  Experiment to what works for you.  Swelling and bruising can take several weeks to resolve.   c. It is helpful to take an over-the-counter pain medication regularly for the first few weeks.  Choose one of the following that works best for you: i. Naproxen (Aleve, etc)  Two 220mg  tabs twice a day ii. Ibuprofen (Advil, etc) Three 200mg  tabs four times a day (every meal & bedtime) iii. Acetaminophen (Tylenol, etc) 325-650mg  four times a day (every meal & bedtime) d. A  prescription for pain medication should be given to you upon discharge.  Take your pain  medication as prescribed.  i. If you are having problems/concerns with the prescription medicine (does not control pain, nausea, vomiting, rash, itching, etc), please call us [phone] to see if we need to switch you to a different pain medicine that will work better for you and/or control your side effect better. ii. If you need a refill on your pain medication, please contact your pharmacy.  They will contact our office to request authorization. Prescriptions will not be filled after 5 pm or on week-ends. 4. Avoid getting constipated.  Between the surgery and the pain medications, it is common to experience some constipation.  Increasing fluid intake and taking a fiber supplement (such as Metamucil, Citrucel, FiberCon, MiraLax, etc) 1-2 times a day regularly will usually help prevent this problem from occurring.  A mild laxative (prune juice, Milk of Magnesia, MiraLax, etc) should be taken according to package directions if there are no bowel movements after 48 hours.   5. Wash / shower every day.  You may shower over the dressings as they are waterproof.   6. Remove your waterproof bandages 5 days after surgery.  You may leave the incision open to air.  You may replace a dressing/Band-Aid to cover the incision for comfort if you wish.  Continue to shower over incision(s) after the dressing is off.    7. ACTIVITIES as tolerated:   a. You may resume regular (light) daily activities beginning the next day-such as daily self-care, walking,  climbing stairs-gradually increasing activities as tolerated.  If you can walk 30 minutes without difficulty, it is safe to try more intense activity such as jogging, treadmill, bicycling, low-impact aerobics, swimming, etc. b. Save the most intensive and strenuous activity for last such as sit-ups, heavy lifting, contact sports, etc  Refrain from any heavy lifting or straining until you are off narcotics for pain control.   c. DO NOT PUSH THROUGH PAIN.  Let pain  be your guide: If it hurts to do something, don't do it.  Pain is your body warning you to avoid that activity for another week until the pain goes down. d. You may drive when you are no longer taking prescription pain medication, you can comfortably wear a seatbelt, and you can safely maneuver your car and apply brakes. e. Dennis Bast may have sexual intercourse when it is comfortable.  8. FOLLOW UP in our office a. Please call [facility] at [phone] to set up an appointment to see your surgeon in the office for a follow-up appointment approximately 2-3 weeks after your surgery. b. Make sure that you call for this appointment the day you arrive home to insure a convenient appointment time. 9.  IF YOU HAVE DISABILITY OR FAMILY LEAVE FORMS, BRING THEM TO THE OFFICE FOR PROCESSING.  DO NOT GIVE THEM TO YOUR DOCTOR.  WHEN TO CALL us [phone]: 1. Poor pain control 2. Reactions / problems with new medications (rash/itching, nausea, etc)  3. Fever over 101.5 F (38.5 C) 4. Inability to urinate 5. Nausea and/or vomiting 6. Worsening swelling or bruising 7. Continued bleeding from incision. 8. Increased pain, redness, or drainage from the incision   The clinic staff is available to answer your questions during regular business hours (8:30am-5pm).  Please don't hesitate to call and ask to speak to one of our nurses for clinical concerns.   If you have a medical emergency, go to the nearest emergency room or call 911.  A surgeon from [facility] is always on call at the hospitals in [facility], [place], [place], [place], [place]  [address] ?  [address] [address], [place], [place]   [address] MAIN: [phone] ? TOLL FREE: [phone] ? FAX: [phone] [email or web address]  Hematoma A hematoma is a collection of blood under the skin, in an organ, in a body space, in a joint space, or in other tissue. The blood can clot to form a lump that  you can see and feel. The lump is often firm and may sometimes become sore and tender. Most hematomas get better in a few days to weeks. However, some hematomas may be serious and require medical care. Hematomas can range in size from very small to very large. CAUSES  A hematoma can be caused by a blunt or penetrating injury. It can also be caused by spontaneous leakage from a blood vessel under the skin. Spontaneous leakage from a blood vessel is more likely to occur in older people, especially those taking blood thinners. Sometimes, a hematoma can develop after certain medical procedures. SIGNS AND SYMPTOMS   A firm lump on the body.  Possible pain and tenderness in the area.  Bruising.Blue, dark blue, purple-red, or yellowish skin may appear at the site of the hematoma if the hematoma is close to the surface of the skin. For hematomas in deeper tissues or body spaces, the signs and symptoms may be subtle. For example, an intra-abdominal hematoma may cause abdominal pain,  weakness, fainting, and shortness of breath. An intracranial hematoma may cause a headache or symptoms such as weakness, trouble speaking, or a change in consciousness. DIAGNOSIS  A hematoma can usually be diagnosed based on your medical history and a physical exam. Imaging tests may be needed if your health care provider suspects a hematoma in deeper tissues or body spaces, such as the abdomen, head, or chest. These tests may include ultrasonography or a CT scan.  TREATMENT  Hematomas usually go away on their own over time. Rarely does the blood need to be drained out of the body. Large hematomas or those that may affect vital organs will sometimes need surgical drainage or monitoring. HOME CARE INSTRUCTIONS   Apply ice to the injured area:   Put ice in a plastic bag.   Place a towel between your skin and the bag.   Leave the ice on for 20 minutes, 2 3 times a day for the first 1 to 2 days.   After the first 2 days,  switch to using warm compresses on the hematoma.   Elevate the injured area to help decrease pain and swelling. Wrapping the area with an elastic bandage may also be helpful. Compression helps to reduce swelling and promotes shrinking of the hematoma. Make sure the bandage is not wrapped too tight.   If your hematoma is on a lower extremity and is painful, crutches may be helpful for a couple days.   Only take over-the-counter or prescription medicines as directed by your health care provider. SEEK IMMEDIATE MEDICAL CARE IF:   You have increasing pain, or your pain is not controlled with medicine.   You have a fever.   You have worsening swelling or discoloration.   Your skin over the hematoma breaks or starts bleeding.   Your hematoma is in your chest or abdomen and you have weakness, shortness of breath, or a change in consciousness.  Your hematoma is on your scalp (caused by a fall or injury) and you have a worsening headache or a change in alertness or consciousness. MAKE SURE YOU:   Understand these instructions.  Will watch your condition.  Will get help right away if you are not doing well or get worse. Document Released: 08/28/2003 Document Revised: 09/15/2012 Document Reviewed: 06/23/2012 [facility] Patient Information 2014 [place].

## 2013-07-19 ENCOUNTER — Encounter (INDEPENDENT_AMBULATORY_CARE_PROVIDER_SITE_OTHER): Payer: BC Managed Care – PPO | Admitting: Surgery

## 2013-07-28 ENCOUNTER — Encounter (INDEPENDENT_AMBULATORY_CARE_PROVIDER_SITE_OTHER): Payer: Self-pay | Admitting: Surgery

## 2013-07-28 ENCOUNTER — Ambulatory Visit (INDEPENDENT_AMBULATORY_CARE_PROVIDER_SITE_OTHER): Payer: BC Managed Care – PPO | Admitting: Surgery

## 2013-07-28 VITALS — BP 142/86 | HR 74 | Resp 16 | Ht 75.0 in | Wt 210.0 lb

## 2013-07-28 DIAGNOSIS — S301XXA Contusion of abdominal wall, initial encounter: Secondary | ICD-10-CM

## 2013-07-28 DIAGNOSIS — S3012XD Contusion of groin, subsequent encounter: Secondary | ICD-10-CM

## 2013-07-28 DIAGNOSIS — Z5189 Encounter for other specified aftercare: Secondary | ICD-10-CM

## 2013-07-28 DIAGNOSIS — K4020 Bilateral inguinal hernia, without obstruction or gangrene, not specified as recurrent: Secondary | ICD-10-CM

## 2013-07-28 DIAGNOSIS — S301XXD Contusion of abdominal wall, subsequent encounter: Secondary | ICD-10-CM

## 2013-07-28 NOTE — Progress Notes (Addendum)
Subjective:     Patient ID: Trevor Rangel, male   DOB: 02/21/1950, 63 y.o.   MRN: [id number]  HPI   Note: This dictation was prepared with Dragon/digital dictation along with [facility] technology. Any transcriptional errors that result from this process are unintentional.       Trevor Rangel  1951-01-24 [id number]  Patient Care Team: [name]. [name], FNP as PCP - General (Nurse Practitioner)  Procedure (Date: 03/17/2013):  POST-OPERATIVE DIAGNOSIS:  RIGHT INGUINAL HERNIA  LEFT INGUINAL HERNIA   PROCEDURE: Procedure(s):  LAPAROSCOPIC EXPLORATION AND REPAIR OF BILATERAL INGUINAL HERNIAS  INSERTION OF MESH   SURGEON:  [name], MD  This patient returns for surgical re-evaluation.  He started with pain and swelling early.  Recommended ice packs and avoiding nonsteroidals.  Refilled narcotic pain medicines.  Offered to see him sooner.  Have not heard from him in weeks.  Finally came one month later with large right groin hematoma.  Noted it would take months to resolve and recommended followup.  He comes now over a month later.  He is glad that the swelling has gone down.  No longer having pain.  He wants to get back to work.  However he wants to wait.  Testicle enlarged but no longer sensitive with walking and activity.  Trying to get out to exercise more.  No fevers or chills.  Patient Active Problem List   Diagnosis Date Noted  . Groin hematoma - right 04/08/2013  . Bilateral inguinal hernia (BIH) s/p lap repair w mesh 03/17/2013 12/15/2012  . BPH (benign prostatic hyperplasia)     Past Medical History  Diagnosis Date  . Hypertension   . DDD (degenerative disc disease), lumbar   . Sciatica   . BPH (benign prostatic hyperplasia)     Past Surgical History  Procedure Laterality Date  . Inguinal hernia repair Left 05/04/2009    Dr Rise Patience  . Colonoscopy  ~2004    "Normal" per patient  . Inguinal hernia repair Bilateral 03/17/2013    Procedure: LAPAROSCOPIC  EXPLORATION AND REPAIR OF BILATERAL INGUINAL HERNIAS;  Surgeon: [name], MD;  Location: [facility];  Service: General;  Laterality: Bilateral;  . Insertion of mesh Bilateral 03/17/2013    Procedure: INSERTION OF MESH;  Surgeon: [name], MD;  Location: [facility];  Service: General;  Laterality: Bilateral;    History   Social History  . Marital Status: Single    Spouse Name: N/A    Number of Children: N/A  . Years of Education: N/A   Occupational History  . Not on file.   Social History Main Topics  . Smoking status: Former Smoker    Types: Cigarettes    Quit date: 03/10/2010  . Smokeless tobacco: Never Used  . Alcohol Use: Yes     Comment: beer occ -weekends only  . Drug Use: No  . Sexual Activity: Yes   Other Topics Concern  . Not on file   Social History Narrative  . No narrative on file    Family History  Problem Relation Age of Onset  . Heart disease Mother   . Cancer Father     prostate    Current Outpatient Prescriptions  Medication Sig Dispense Refill  . amLODipine (NORVASC) 10 MG tablet       . furosemide (LASIX) 20 MG tablet       . hydrALAZINE (APRESOLINE) 25 MG tablet       . Menthol-Methyl Salicylate (MUSCLE RUB) 10-15 %  CREA Apply 1 application topically 3 (three) times daily as needed for muscle pain.      . methylcellulose (ARTIFICIAL TEARS) 1 % ophthalmic solution Place 1 drop into both eyes 3 (three) times daily as needed (dry eyes).      . ranitidine (ZANTAC) 150 MG tablet Take 150 mg by mouth daily.      . simvastatin (ZOCOR) 20 MG tablet       . triamterene-hydrochlorothiazide (DYAZIDE) 37.5-25 MG per capsule Take 1 capsule by mouth at bedtime.      . methocarbamol (ROBAXIN) 750 MG tablet Take 1 tablet (750 mg total) by mouth 4 (four) times daily as needed (use for muscle cramps/pain).  30 tablet  2   No current facility-administered medications for this visit.     No Known Allergies  BP 142/86  Pulse 74  Resp 16  Ht 6\' 3"  (1.905  m)  Wt 210 lb (95.255 kg)  BMI 26.25 kg/m2  Dg Chest 2 View  03/10/2013   CLINICAL DATA:  63 year old male preoperative study for hernia repair. Hypertension. Initial encounter.  EXAM: CHEST  2 VIEW  COMPARISON:  02/03/2009.  FINDINGS: Lung volumes remain normal. Cardiac size at the upper limits of normal. Other mediastinal contours are within normal limits. No pneumothorax, pulmonary edema pleural effusion or confluent pulmonary opacity. No acute osseous abnormality identified.  IMPRESSION: No acute cardiopulmonary abnormality.   Electronically Signed   By: Lars Pinks M.D.   On: 03/10/2013 13:49   Ct Abdomen Pelvis W Contrast  03/18/2013   CLINICAL DATA:  Hernia repair 03/17/2013. No pain and swelling in lower abdomen and testicles.  EXAM: CT ABDOMEN AND PELVIS WITH CONTRAST  TECHNIQUE: Multidetector CT imaging of the abdomen and pelvis was performed using the standard protocol following bolus administration of intravenous contrast.  CONTRAST:  59mL OMNIPAQUE IOHEXOL 300 MG/ML SOLN, 1106mL OMNIPAQUE IOHEXOL 300 MG/ML SOLN  COMPARISON:  None.  FINDINGS: There is mild atelectasis at the right lung base. Benign appearing cystic lesions in the lower lobes are thin-walled. No pericardial fluid.  There is gas dissecting through the left in right abdominal wall musculature related to recent surgery. Gas extends from the left and right hemiscrotum.  There is a large hematoma within the right inguinal canal measuring 6.7 x 7.4 by approximately 11.0 cm. This hematoma extends into the upper aspect of the right hemiscrotum. Gas dissects along the base the penis in into the skin of the penis.  Small amount intraperitoneal free air is also noted seen of over the margins of the liver. = The gallbladder, pancreas, spleen, adrenal glands, kidneys are normal.  The stomach, small bowel or and colon are unremarkable.  Abdominal aorta is normal caliber. No retroperitoneal periportal lymphadenopathy.  No significant free fluid the  pelvis. The bladder and prostate gland are normal. The several vesicles are normal. No aggressive osseous lesion.  IMPRESSION: 1. Large hematoma within the right inguinal canal extends into the right hemiscrotum. 2. Extensive subcutaneous gas centered within the left and right hemiscrotum and dissecting into the base of the penis and along the left or right abdominal wall musculature of level of the sternum. 3. Small intraperitoneal free air.   Electronically Signed   By: Suzy Bouchard M.D.   On: 03/18/2013 00:42     Review of Systems  Constitutional: Negative for fever, chills and diaphoresis.  HENT: Negative for sore throat and trouble swallowing.   Eyes: Negative for photophobia and visual disturbance.  Respiratory: Negative for  choking and shortness of breath.   Cardiovascular: Negative for chest pain and palpitations.  Gastrointestinal: Negative for nausea, vomiting, abdominal distention, anal bleeding and rectal pain.  Genitourinary: Positive for scrotal swelling. Negative for dysuria, urgency, difficulty urinating and testicular pain.  Musculoskeletal: Negative for arthralgias, gait problem, myalgias and neck pain.  Skin: Negative for color change and rash.  Neurological: Negative for dizziness, speech difficulty, weakness and numbness.  Hematological: Negative for adenopathy.  Psychiatric/Behavioral: Negative for hallucinations, confusion and agitation.       Objective:   Physical Exam  Constitutional: He is oriented to person, place, and time. He appears well-developed and well-nourished. No distress.  HENT:  Head: Normocephalic.  Mouth/Throat: Oropharynx is clear and moist. No oropharyngeal exudate.  Eyes: Conjunctivae and EOM are normal. Pupils are equal, round, and reactive to light. No scleral icterus.  Neck: Normal range of motion. No tracheal deviation present.  Cardiovascular: Normal rate, normal heart sounds and intact distal pulses.   Pulmonary/Chest: Effort normal.  No respiratory distress.  Abdominal: Soft. He exhibits no distension. There is no tenderness. Hernia confirmed negative in the right inguinal area and confirmed negative in the left inguinal area.  Incisions clean with normal healing ridges.  No hernias  Genitourinary:     Musculoskeletal: Normal range of motion. He exhibits no tenderness.  Neurological: He is alert and oriented to person, place, and time. No cranial nerve deficit. He exhibits normal muscle tone. Coordination normal.  Skin: Skin is warm and dry. No rash noted. He is not diaphoretic.  Psychiatric: He has a normal mood and affect. His behavior is normal.       Assessment:     Status post repair of bilateral hernias with giant right hernia resulting in giant right scrotal hematoma - resolving.     Plan:     He is agreeing that this will continue to heal with time.  Safest thing is to avoid any surgery or needle aspirations as it could cause new bleeding or infection.  I noted the safest thing to do is to let his body heal this as the months ago by.  It will still take a while, at least 2-3 months.  It will  improve.   Increase activity as tolerated to regular activity.  Low impact exercise such as walking an hour a day at least ideal.  Do not push through pain.  He is afraid to go back to work as he has a lot of heavy lifting.  Reasonable to wait a few more weeks but not indefinitely.  Pain control.  Heat/Tylenol around-the-clock.  No more oxycodone needed.     Diet as tolerated.  Low fat high fiber diet ideal.  Bowel regimen with 30 g fiber a day and fiber supplement as needed to avoid problems.  Return to clinic q6 weeks to follow the hematoma.   Another option is just to call me if it does not resolve.  He felt more comfortable with letting me know if it is not gone in a few months.  Instructions discussed.  Followup with primary care physician for other health issues as would normally be done.  Consider screening for  malignancies (breast, prostate, colon, melanoma, etc) as appropriate.  Questions answered.  The patient expressed understanding and appreciation

## 2013-07-28 NOTE — Patient Instructions (Signed)
Scrotal Hematoma Scrotal hematoma is the collection of blood inside the sac (scrotum) that contains the testicles, the blood vessels that serve the testicles, and the structures that help deliver sperm and semen. The rich blood supply in this area makes it easy for a significant amount of blood to collect in the scrotum even after a minor injury or a small operation like a vasectomy. In addition, the tissues in the scrotum are very loose. There is no pressure from the surrounding tissue to help stop bleeding once it starts.  HOME CARE INSTRUCTIONS  Monitor your hematoma for any changes. The following actions may help to alleviate any discomfort you are experiencing:  Apply ice to the injured area:  Put ice in a plastic bag.  Place a towel between your skin and the bag.  Leave the ice on for 20 minutes, 2-3 times a day.  Use scrotal support.  Take appropriate pain medications prescribed by your health care provider.  Minimize physical activity and avoid any activities that would place direct pressure on the scrotum and penis (such as bicycling and horseback riding).  Avoid sexual activity until advised otherwise by your health care provider.  If your health care provider has given you a follow-up appointment, it is very important to keep that appointment. SEEK MEDICAL CARE IF:  You have cloudy or dark urine.  You have frequent urination.  You develop scrotal swelling that does not improve as expected after 4 days. SEEK IMMEDIATE MEDICAL CARE IF:  You develop recurrent or severe pain that is not controlled by prescribed medicine.  You have nausea or vomiting.  There is increased swelling of the scrotum.  You have increased abdominal pain.  You have a fever or chills.  You have difficulty starting urination.  You have painful urination.  Your urine flow is slow.  There is blood in your urine.  You are unable to urinate.  You experience redness spreading from your  scrotum into your groin or thighs. MAKE SURE YOU:  Understand these instructions.  Will watch your condition.  Will get help right away if you are not doing well or get worse. Document Released: 04/14/2006 Document Revised: 09/15/2012 Document Reviewed: 06/17/2012 [facility] Patient Information 2015 [place], [place]. This information is not intended to replace advice given to you by your health care provider. Make sure you discuss any questions you have with your health care provider.  HERNIA REPAIR: POST OP INSTRUCTIONS  1. DIET: Follow a light bland diet the first 24 hours after arrival home, such as soup, liquids, crackers, etc.  Be sure to include lots of fluids daily.  Avoid fast food or heavy meals as your are more likely to get nauseated.  Eat a low fat the next few days after surgery. 2. Take your usually prescribed home medications unless otherwise directed. 3. PAIN CONTROL: a. Pain is best controlled by a usual combination of three different methods TOGETHER: i. Ice/Heat ii. Over the counter pain medication iii. Prescription pain medication b. Most patients will experience some swelling and bruising around the hernia(s) such as the bellybutton, groins, or old incisions.  Ice packs or heating pads (30-60 minutes up to 6 times a day) will help. Use ice for the first few days to help decrease swelling and bruising, then switch to heat to help relax tight/sore spots and speed recovery.  Some people prefer to use ice alone, heat alone, alternating between ice & heat.  Experiment to what works for you.  Swelling and bruising  can take several weeks to resolve.   c. It is helpful to take an over-the-counter pain medication regularly for the first few weeks.  Choose one of the following that works best for you: i. Naproxen (Aleve, etc)  Two 220mg  tabs twice a day ii. Ibuprofen (Advil, etc) Three 200mg  tabs four times a day (every meal & bedtime) iii. Acetaminophen (Tylenol, etc) 325-650mg  four  times a day (every meal & bedtime) d. A  prescription for pain medication should be given to you upon discharge.  Take your pain medication as prescribed.  i. If you are having problems/concerns with the prescription medicine (does not control pain, nausea, vomiting, rash, itching, etc), please call us [phone] to see if we need to switch you to a different pain medicine that will work better for you and/or control your side effect better. ii. If you need a refill on your pain medication, please contact your pharmacy.  They will contact our office to request authorization. Prescriptions will not be filled after 5 pm or on week-ends. 4. Avoid getting constipated.  Between the surgery and the pain medications, it is common to experience some constipation.  Increasing fluid intake and taking a fiber supplement (such as Metamucil, Citrucel, FiberCon, MiraLax, etc) 1-2 times a day regularly will usually help prevent this problem from occurring.  A mild laxative (prune juice, Milk of Magnesia, MiraLax, etc) should be taken according to package directions if there are no bowel movements after 48 hours.   5. Wash / shower every day.  You may shower over the dressings as they are waterproof.   6. Remove your waterproof bandages 5 days after surgery.  You may leave the incision open to air.  You may replace a dressing/Band-Aid to cover the incision for comfort if you wish.  Continue to shower over incision(s) after the dressing is off.    7. ACTIVITIES as tolerated:   a. You may resume regular (light) daily activities beginning the next day-such as daily self-care, walking, climbing stairs-gradually increasing activities as tolerated.  If you can walk 30 minutes without difficulty, it is safe to try more intense activity such as jogging, treadmill, bicycling, low-impact aerobics, swimming, etc. b. Save the most intensive and strenuous activity for last such as sit-ups, heavy lifting, contact sports, etc   Refrain from any heavy lifting or straining until you are off narcotics for pain control.   c. DO NOT PUSH THROUGH PAIN.  Let pain be your guide: If it hurts to do something, don't do it.  Pain is your body warning you to avoid that activity for another week until the pain goes down. d. You may drive when you are no longer taking prescription pain medication, you can comfortably wear a seatbelt, and you can safely maneuver your car and apply brakes. e. Dennis Bast may have sexual intercourse when it is comfortable.  8. FOLLOW UP in our office a. Please call [facility] at [phone] to set up an appointment to see your surgeon in the office for a follow-up appointment approximately 2-3 weeks after your surgery. b. Make sure that you call for this appointment the day you arrive home to insure a convenient appointment time. 9.  IF YOU HAVE DISABILITY OR FAMILY LEAVE FORMS, BRING THEM TO THE OFFICE FOR PROCESSING.  DO NOT GIVE THEM TO YOUR DOCTOR.  WHEN TO CALL us [phone]: 1. Poor pain control 2. Reactions / problems with new medications (rash/itching, nausea, etc)  3. Fever over 101.5  F (38.5 C) 4. Inability to urinate 5. Nausea and/or vomiting 6. Worsening swelling or bruising 7. Continued bleeding from incision. 8. Increased pain, redness, or drainage from the incision   The clinic staff is available to answer your questions during regular business hours (8:30am-5pm).  Please don't hesitate to call and ask to speak to one of our nurses for clinical concerns.   If you have a medical emergency, go to the nearest emergency room or call 911.  A surgeon from [facility] is always on call at the hospitals in [facility], [place], [place], [place], [place]  [address] ?  [address] [address], [place], [place]   [address] MAIN: [phone] ? TOLL FREE: [phone] ? FAX: [phone] [email or web address]

## 2013-09-09 ENCOUNTER — Emergency Department (HOSPITAL_COMMUNITY)
Admission: EM | Admit: 2013-09-09 | Discharge: 2013-09-09 | Disposition: A | Discharge status: Home / Self Care | Payer: BC Managed Care – PPO | Attending: Emergency Medicine | Admitting: Emergency Medicine

## 2013-09-09 ENCOUNTER — Encounter (HOSPITAL_COMMUNITY): Payer: Self-pay | Admitting: Emergency Medicine

## 2013-09-09 DIAGNOSIS — Z87891 Personal history of nicotine dependence: Secondary | ICD-10-CM | POA: Insufficient documentation

## 2013-09-09 DIAGNOSIS — Z9889 Other specified postprocedural states: Secondary | ICD-10-CM | POA: Insufficient documentation

## 2013-09-09 DIAGNOSIS — Z79899 Other long term (current) drug therapy: Secondary | ICD-10-CM | POA: Diagnosis not present

## 2013-09-09 DIAGNOSIS — Z87448 Personal history of other diseases of urinary system: Secondary | ICD-10-CM | POA: Insufficient documentation

## 2013-09-09 DIAGNOSIS — I10 Essential (primary) hypertension: Secondary | ICD-10-CM | POA: Diagnosis not present

## 2013-09-09 DIAGNOSIS — M25569 Pain in unspecified knee: Secondary | ICD-10-CM | POA: Insufficient documentation

## 2013-09-09 DIAGNOSIS — G8918 Other acute postprocedural pain: Secondary | ICD-10-CM | POA: Insufficient documentation

## 2013-09-09 MED ORDER — OXYCODONE-ACETAMINOPHEN 5-325 MG PO TABS: ORAL_TABLET | ORAL | Status: DC

## 2013-09-09 MED ORDER — HYDROMORPHONE HCL PF 2 MG/ML IJ SOLN
2.0000 mg | Freq: Once | INTRAMUSCULAR | Status: AC
  Administered 2013-09-09: 2 mg via INTRAMUSCULAR
  Filled 2013-09-09: qty 1

## 2013-09-09 MED ORDER — OXYCODONE-ACETAMINOPHEN 5-325 MG PO TABS
2.0000 | ORAL_TABLET | Freq: Once | ORAL | Status: AC
  Administered 2013-09-09: 2 via ORAL
  Filled 2013-09-09: qty 2

## 2013-09-09 NOTE — ED Notes (Signed)
Patient c/o continued pain to right knee.

## 2013-09-09 NOTE — Discharge Instructions (Signed)
°Emergency Department Resource Guide °1) Find a Doctor and Pay Out of Pocket °Although you won't have to find out who is covered by your insurance plan, it is a good idea to ask around and get recommendations. You will then need to call the office and see if the doctor you have chosen will accept you as a new patient and what types of options they offer for patients who are self-pay. Some doctors offer discounts or will set up payment plans for their patients who do not have insurance, but you will need to ask so you aren't surprised when you get to your appointment. ° °2) Contact Your Local Health Department °Not all health departments have doctors that can see patients for sick visits, but many do, so it is worth a call to see if yours does. If you don't know where your local health department is, you can check in your phone book. The CDC also has a tool to help you locate your state's health department, and many state websites also have listings of all of their local health departments. ° °3) Find a Walk-in Clinic °If your illness is not likely to be very severe or complicated, you may want to try a walk in clinic. These are popping up all over the country in pharmacies, drugstores, and shopping centers. They're usually staffed by nurse practitioners or physician assistants that have been trained to treat common illnesses and complaints. They're usually fairly quick and inexpensive. However, if you have serious medical issues or chronic medical problems, these are probably not your best option. ° °No Primary Care Doctor: °- Call [facility] at  832-8000 - they can help you locate a primary care doctor that  accepts your insurance, provides certain services, etc. °- Physician Referral Service- 1-800-533-3463 ° °Chronic Pain Problems: °Organization         Address  Phone   Notes  °[facility]  ([phone]) [phone] Patients need to be referred by their primary care doctor.  ° °Medication  Assistance: °Organization         Address  Phone   Notes  °[place] [facility] [address] °[place], [place] [address] (336) 641-8030 --Must be a resident of [place] °-- Must have NO insurance coverage whatsoever (no Medicaid/ Medicare, etc.) °-- The pt. MUST have a primary care doctor that directs their care regularly and follows them in the community °  °[facility]  (866) 331-1348   °[employer]  (888) 892-1162   ° °Agencies that provide inexpensive medical care: °Organization         Address  Phone   Notes  °[facility]  (336) 832-8035   °[facility]    (336) 832-7272   °[facility] [address] °[place],  [address] (336) 832-4777   °[facility] [address], °[place] (336) 271-4999   °[facility]    (336) 373-0678   °[facility]    (336) 272-1050   °[facility] ° [address], [place] Phone:  (336) 832-4444, Fax:  (336) 832-4440 Hours of Operation:  9 am - 6 pm, M-F.  Also accepts Medicaid/Medicare and self-pay.  °[facility] ° [address], [place] Phone: (336) 832-3150, Fax: (336) [phone]. Hours of Operation:  8:30 am - 5:30 pm, M-F.  Also accepts Medicaid and self-pay.  °[facility] [place] [address], [place] Phone: (336) 878-6027   °[facility] [address], [place], [place] (336)723-1848, Ext. 123 Mondays & Thursdays: 7-9 AM.  First 15 patients are seen on a first come, first serve basis. °  ° °Medicaid-accepting [place] Providers: ° °Organization         Address  Phone   Notes  °[facility] [address], [place] (336) 641-2100 Also accepts self-pay patients.  °[facility] [address], [place] ° (336) 856-9996   °[facility] [address], [place]  (336) 288-8857   °[facility] [address] [place] [address], [place] (336) 299-7000   °Veita Bland [address], [place]  ° (336) 373-1557 Only accepts [place] Access Medicaid patients after they have their name applied to their card.  ° °Self-Pay (no insurance) in [place]: ° °Organization         Address  Phone   Notes  °Sickle Cell Patients, [facility] [address], [place] (336) 832-1970   °[facility] [address], [place] (336) 832-4400   °[facility] ° [address], [place] (336) 992-4800   °[facility]/Dr. Osei-Bonsu ° [address] [place] [address], [place] or [address], [place] (336) 841-8500 Phone number for both [place] and [place] locations is the same.  °[facility] [address], [place] (336) 299-0000   °[facility] [address] [place] [address], [place] or [address] (336) 852-7530 °(336) 878-2260   °[facility] [address], [place] (336) 350-1642, phone; (336) 294-5005, fax Sees patients 1st and 3rd Saturday of every month.  Must not qualify for public or private insurance (i.e. Medicaid, Medicare, [place] Health Choice, Veterans' Benefits) • Household income should be no more than 200% of the poverty level •The clinic cannot treat you if you are pregnant or think you are pregnant • Sexually transmitted diseases are not treated at the clinic.  ° ° °Dental Care: °Organization         Address  Phone  Notes  °[place] [facility] [address], [place] (336) 641-6152 Accepts children up to age 21 who are enrolled in Medicaid or [place] Health Choice; pregnant women with a Medicaid card; and children who have applied for Medicaid or [place] Health Choice, but were declined, whose parents can pay a reduced fee at time of service.  °[place]  [facility] [place]  [address], [place] (336) 641-7733 Accepts children up to age 21 who are enrolled in Medicaid or [place] Health Choice; pregnant women with a Medicaid card; and children who have applied for Medicaid or [place] Health Choice, but were declined, whose parents can pay a reduced fee at time of service.  °[facility] ° [address], [place] (336) 641-4533 Patients are seen by appointment only. Walk-ins are not accepted. [facility] will see patients 18 years of age and older. °Monday - Tuesday (8am-5pm) °Most Wednesdays (8:30-5pm) °$30 per visit, cash only  °[facility] ° [address], [place] (336) 641-4533 Patients are seen by appointment only. Walk-ins are not accepted. [facility] will see patients 18 years of age and older. °One   Wednesday Evening (Monthly: Volunteer Based).  $30 per visit, cash only  °[facility]  (919) 537-3737 for adults; Children under age 4, call Graduate Pediatric Dentistry at ([phone]) [phone]. Children aged 4-14, please call ([phone]) [phone] to request a pediatric application. ° Dental services are provided in all areas of dental care including fillings, crowns and bridges, complete and partial dentures, implants, gum treatment, root canals, and extractions. Preventive care is also provided. Treatment is provided to both adults and children. °Patients are selected via a lottery and there is often a waiting list. °  °[facility] [address], °[place] ° (336) 763-8833 [email or web address] °  °[facility] [address], [place], [place] (336)723-1848, Ext. 123 Second and Fourth Thursday of each month, opens at 6:30 AM; Clinic ends at 9 AM.  Patients are seen on a first-come first-served basis, and a limited number are seen during each clinic.  ° °[facility] ° [address], [place], [place] (336) 723-7904    Eligibility Requirements °You must have lived in [place], [place], or [place] counties for at least the last three months. °  You cannot be eligible for state or federal sponsored healthcare insurance, including Veterans Administration, Medicaid, or Medicare. °  You generally cannot be eligible for healthcare insurance through your employer.  °  How to apply: °Eligibility screenings are held every Tuesday and Wednesday afternoon from 1:00 pm until 4:00 pm. You do not need an appointment for the interview!  °[facility] [address], [place], [place] 336-631-2330   °[facility]  336-342-8273   °[facility]  336-703-3100   °[facility]  336-570-6415   ° °Behavioral Health Resources in the Community: °Intensive Outpatient Programs °Organization         Address  Phone  Notes  °[facility] [address], [place], [place] 336-878-6098   °[facility] [address], [place], [place] 336-832-9800   °[facility] [address], [place], [place] ° 336-882-2125   °[facility] [address],  °[place], [place] 1-800-853-5163 or 336-641-4981   °Substance Abuse Resources °Organization         Address  Phone  Notes  °[facility]  336-882-2125   °[facility]  336-784-9470   °[facility]  336-285-9073   °[facility]  336-845-3988   °Residential & Outpatient Substance Abuse Program  1-800-659-3381   °Psychological Services °Organization         Address  Phone  Notes  °[facility]  336- 832-9600   °[facility]  336- 378-7881   °[facility] [address], [place] 1-800-853-5163 or 336-641-4981   ° °Mobile Crisis Teams °Organization         Address  Phone  Notes  °[facility], Mobile Crisis Care Unit  1-877-626-1772   °Assertive °[facility] ° [address]  [place], [place] 336-834-9664   °Sharon DeEsch [address] °[place] 336-554-5454   ° °Self-Help/Support Groups °Organization         Address  Phone             Notes  °[facility]  - variety of support groups  336- 373-1402 Call for more information  °Narcotics Anonymous (NA), [facility] [address], °[place]  2 meetings at this location  ° °  Residential Treatment Programs Organization         Address  Phone  Notes  [facility] [address],    [place]  1-463-253-8036   [facility]  [address], [place] [address], [place], [place]   [place], [place] [phone] Admissions: 8am-3pm M-F  [facility] [place] [address],    [place], [place] [phone]   [facility] [address] [place], [place], [place]   [facility] [address]  [place], [place]   [facility] - Intensive Outpatient [address], [place], [place]   [facility] [place]  [place], [place] 1-819-392-5891 or 580-227-0467   [facility] [address], [place], [place] Accepts Medicaid  [facility] [address]  [place] 1-331-506-4882 Substance Abuse/Addiction Treatment   [facility]         Address  Phone  Notes  [facility]  5418444236   Domenic Schwab, PhD [address] [place], [place]   915-453-3693 or 608-382-9145   [facility] [place], [place] 830-363-1267   [facility] [address], [place], [place] (442)779-6588 Insurance/Medicaid/sponsorship through [facility] Families [address] [place]                                    [place], [place] (613)[phone] [facility] [address], [place] [phone]    Dr. Adele Schilder  [phone]   [facility] Dept. 1) [address], [place] 2) [place] 3)  [address], [place] [phone] [phone]  (684)[phone]   [place] (915) 059-6433 or ([phone] (After Hours)      Take the prescription as directed. Follow the instructions given to you today by your Orthopedic Surgeon after your procedure.  Apply ice to the area(s) of discomfort, for 15 minutes at a time, several times per day for the next few days.  Do not fall asleep on an ice pack. Elevate as much as possible. Walk with your crutches only. Call your Orthopedic doctor on Monday to schedule a follow up appointment in the next 3 days. Return to the Emergency Department immediately if worsening.

## 2013-09-09 NOTE — ED Notes (Addendum)
Pt stated he had some type of orthopedic surgery done earlier today at [facility] by Dr. Frederik Pear. Pt c/o increased pain and swelling.

## 2013-09-09 NOTE — ED Provider Notes (Signed)
CSN: [id number]     Arrival date & time 09/09/13  1925 History   First MD Initiated Contact with Patient 09/09/13 1939     Chief Complaint  Patient presents with  . post operative knee pain      HPI Pt was seen at [place]. Per pt and his wife, c/o gradual onset and persistence of constant right knee "pain" that began this afternoon. Pt is s/p "some type of orthopedic procedure on his right knee" that occurred at the [facility] by Dr. Frederik Pear. Pt was rx norco, but "they pharmacy said they didn't have any and to come back on Tuesday." Pt's wife states they were given "2 pills" that pt took at approximately 1530. Pt states his pain began "after that wore off." Pt states he "can't bend it" and "can't walk on it." Pt does not know if he should be walking with crutches or not. Pt has not applied ice or elevated his leg. Pt and his wife did not call his Ortho MD before coming to the ED. Denies fevers, no falls.    Past Medical History  Diagnosis Date  . Hypertension   . DDD (degenerative disc disease), lumbar   . Sciatica   . BPH (benign prostatic hyperplasia)    Past Surgical History  Procedure Laterality Date  . Inguinal hernia repair Left 05/04/2009    Dr Rise Patience  . Colonoscopy  ~2004    "Normal" per patient  . Inguinal hernia repair Bilateral 03/17/2013    Procedure: LAPAROSCOPIC EXPLORATION AND REPAIR OF BILATERAL INGUINAL HERNIAS;  Surgeon: [name], MD;  Location: [facility];  Service: General;  Laterality: Bilateral;  . Insertion of mesh Bilateral 03/17/2013    Procedure: INSERTION OF MESH;  Surgeon: [name], MD;  Location: [facility];  Service: General;  Laterality: Bilateral;  . Knee surgery     Family History  Problem Relation Age of Onset  . Heart disease Mother   . Cancer Father     prostate   History  Substance Use Topics  . Smoking status: Former Smoker    Types: Cigarettes    Quit date: 03/10/2010  . Smokeless tobacco: Never Used  . Alcohol  Use: Yes     Comment: beer occ -weekends only    Review of Systems ROS: Statement: All systems negative except as marked or noted in the HPI; Constitutional: Negative for fever and chills. ; ; Eyes: Negative for eye pain, redness and discharge. ; ; ENMT: Negative for ear pain, hoarseness, nasal congestion, sinus pressure and sore throat. ; ; Cardiovascular: Negative for chest pain, palpitations, diaphoresis, dyspnea and peripheral edema. ; ; Respiratory: Negative for cough, wheezing and stridor. ; ; Gastrointestinal: Negative for nausea, vomiting, diarrhea, abdominal pain, blood in stool, hematemesis, jaundice and rectal bleeding. . ; ; Genitourinary: Negative for dysuria, flank pain and hematuria. ; ; Musculoskeletal: +right knee pain. Negative for back pain and neck pain. Negative for deformity and trauma.; ; Skin: Negative for pruritus, rash, abrasions, blisters, bruising and skin lesion.; ; Neuro: Negative for headache, lightheadedness and neck stiffness. Negative for weakness, altered level of consciousness , altered mental status, extremity weakness, paresthesias, involuntary movement, seizure and syncope.      Allergies  Review of patient's allergies indicates no known allergies.  Home Medications   Prior to Admission medications   Medication Sig Start Date End Date Taking? Authorizing Provider  amLODipine (NORVASC) 10 MG tablet Take 10 mg by mouth daily.  05/10/13  Yes Historical Provider, MD  furosemide (LASIX) 20 MG tablet Take 20 mg by mouth 2 (two) times daily.  05/25/13  Yes Historical Provider, MD  hydrALAZINE (APRESOLINE) 25 MG tablet Take 25 mg by mouth 2 (two) times daily.  06/03/13  Yes Historical Provider, MD  HYDROcodone-acetaminophen (NORCO/VICODIN) 5-325 MG per tablet Take 1 tablet by mouth every 6 (six) hours as needed for moderate pain.   Yes Historical Provider, MD  simvastatin (ZOCOR) 20 MG tablet Take 20 mg by mouth at bedtime.  05/25/13  Yes Historical Provider, MD   Menthol-Methyl Salicylate (MUSCLE RUB) 10-15 % CREA Apply 1 application topically 3 (three) times daily as needed for muscle pain.    Historical Provider, MD  methylcellulose (ARTIFICIAL TEARS) 1 % ophthalmic solution Place 1 drop into both eyes 3 (three) times daily as needed (dry eyes).    Historical Provider, MD  ranitidine (ZANTAC) 150 MG tablet Take 150 mg by mouth daily.    Historical Provider, MD   BP 168/102  Pulse 75  Temp(Src) 97.6 F (36.4 C) (Oral)  Resp 26  Ht 6\' 3"  (1.905 m)  Wt 214 lb (97.07 kg)  BMI 26.75 kg/m2  SpO2 97% Physical Exam 1945: Physical examination:  Nursing notes reviewed; Vital signs and O2 SAT reviewed;  Constitutional: Well developed, Well nourished, Well hydrated, Uncomfortable appearing.; Head:  Normocephalic, atraumatic; Eyes: EOMI, PERRL, No scleral icterus; ENMT: Mouth and pharynx normal, Mucous membranes moist; Neck: Supple, Full range of motion, No lymphadenopathy; Cardiovascular: Regular rate and rhythm, No gallop; Respiratory: Breath sounds clear & equal bilaterally, No wheezes.  Speaking full sentences with ease, Normal respiratory effort/excursion; Chest: Nontender, Movement normal; Abdomen: Soft, Nontender, Nondistended, Normal bowel sounds; Genitourinary: No CVA tenderness; Extremities: Pulses normal, No deformity. NMS intact right lower leg/foot, strong pedal pulses. Right knee DSD in place. No calf edema or asymmetry.; Neuro: AA&Ox3, Major CN grossly intact.  Speech clear. No gross focal motor or sensory deficits in extremities.; Skin: Color normal, Warm, Dry.   ED Course  Procedures    MDM  MDM Reviewed: previous chart, nursing note and vitals    2005:  Pt and his wife do not know what surgery he had or what his discharge instructions state. Pt and his wife keep answering "I don't know" to questions. T/C to Ortho PA Danielle on call for Dr. Mayer Camel, case discussed, including:  HPI, pertinent PM/SHx, VS/PE, dx testing, ED course and  treatment:  Pt had arthroscopic knee surgery on meniscus today, pt will be in pain for several days and needs to take his pain meds as directed, he will likely not be able to F/E knee due to expected swelling and pain, keep DSD in place, elevate, ice, WBAT with crutches only, prescribe pain meds to get through until Monday, have pt call the office on Monday for follow up. Dx, and d/w Ortho PA, d/w pt and family.  Questions answered.  Verb understanding. Will dose IM pain meds and reassess.   2145:  Pt's pain improved, states he is ready to go home now. Pt asking for "a stronger pain medication than that other stuff." Pt and his wife are stating they "can't get to a pharmacy" and "don't have time to fill a prescription." ED RN and I explained that we could only dispense 6 tabs of pain meds, and that they will need to fill the prescription to get through to Monday. Pt and wife verb understanding.      Francine Graven, DO 09/12/13 1525

## 2013-09-16 MED FILL — Oxycodone w/ Acetaminophen Tab 5-325 MG: ORAL | Qty: 6 | Status: AC

## 2014-10-26 ENCOUNTER — Encounter: Payer: Self-pay | Admitting: Gastroenterology

## 2014-12-11 ENCOUNTER — Ambulatory Visit (AMBULATORY_SURGERY_CENTER): Payer: Self-pay

## 2014-12-11 VITALS — Ht 75.0 in | Wt 213.4 lb

## 2014-12-11 DIAGNOSIS — Z1211 Encounter for screening for malignant neoplasm of colon: Secondary | ICD-10-CM

## 2014-12-11 NOTE — Progress Notes (Signed)
No allergies to eggs or soy No past problems with anesthesia No home oxygen No diet/weight loss meds  Has email has internet

## 2014-12-14 ENCOUNTER — Encounter: Payer: Self-pay | Admitting: Gastroenterology

## 2014-12-25 ENCOUNTER — Encounter: Payer: Self-pay | Admitting: Gastroenterology

## 2014-12-25 ENCOUNTER — Ambulatory Visit (AMBULATORY_SURGERY_CENTER): Payer: 59 | Admitting: Gastroenterology

## 2014-12-25 VITALS — BP 148/97 | HR 72 | Temp 97.8°F | Resp 20 | Ht 75.0 in | Wt 213.0 lb

## 2014-12-25 DIAGNOSIS — Z1211 Encounter for screening for malignant neoplasm of colon: Secondary | ICD-10-CM | POA: Diagnosis present

## 2014-12-25 DIAGNOSIS — D122 Benign neoplasm of ascending colon: Secondary | ICD-10-CM | POA: Diagnosis not present

## 2014-12-25 MED ORDER — SODIUM CHLORIDE 0.9 % IV SOLN: 500.0000 mL | INTRAVENOUS | Status: DC

## 2014-12-25 NOTE — Op Note (Signed)
[facility]  [address]. [place], [address]   COLONOSCOPY PROCEDURE REPORT  PATIENT: Trevor, Rangel  MR#: [id number] BIRTHDATE: 01/18/1951 , 53  yrs. old GENDER: male ENDOSCOPIST: Milus Banister, MD REFERRED [id number] Blount, M.D. PROCEDURE DATE:  12/25/2014 PROCEDURE:   Colonoscopy, screening and Colonoscopy with snare polypectomy First Screening Colonoscopy - Avg.  risk and is 50 yrs.  old or older - No.  Prior Negative Screening - Now for repeat screening. 10 or more years since last screening  History of Adenoma - Now for follow-up colonoscopy & has been > or = to 3 yrs.  N/A  Polyps removed today? Yes ASA CLASS:   Class II INDICATIONS:Screening for colonic neoplasia, Colorectal Neoplasm Risk Assessment for this procedure is average risk, and (normal colonoscopy in 2004 per patient). MEDICATIONS: Monitored anesthesia care and Propofol 300 mg IV  DESCRIPTION OF PROCEDURE:   After the risks benefits and alternatives of the procedure were thoroughly explained, informed consent was obtained.  The digital rectal exam revealed no abnormalities of the rectum.   The [id number]  endoscope was introduced through the anus and advanced to the cecum, which was identified by both the appendix and ileocecal valve. No adverse events experienced.   The quality of the prep was good.  The instrument was then slowly withdrawn as the colon was fully examined. Estimated blood loss is zero unless otherwise noted in this procedure report.   COLON FINDINGS: A sessile polyp measuring 3 mm in size was found in the ascending colon.  A polypectomy was performed with a cold snare.  The resection was complete, the polyp tissue was completely retrieved and sent to histology.   The examination was otherwise normal.  Retroflexed views revealed no abnormalities. The time to cecum = 4.4 Withdrawal time = 7.8   The scope was withdrawn and the procedure completed. COMPLICATIONS:  There were no immediate complications.  ENDOSCOPIC IMPRESSION: 1.   Sessile polyp was found in the ascending colon; polypectomy was performed with a cold snare 2.   The examination was otherwise normal  RECOMMENDATIONS: If the polyp(s) removed today are proven to be adenomatous (pre-cancerous) polyps, you will need a repeat colonoscopy in 5 years.  Otherwise you should continue to follow colorectal cancer screening guidelines for "routine risk" patients with colonoscopy in 10 years.  You will receive a letter within 1-2 weeks with the results of your biopsy as well as final recommendations.  Please call my office if you have not received a letter after 3 weeks.  eSigned:  Milus Banister, MD 12/25/2014 8:18 AM

## 2014-12-25 NOTE — Progress Notes (Signed)
Called to room to assist during endoscopic procedure.  Patient ID and intended procedure confirmed with present staff. Received instructions for my participation in the procedure from the performing physician.  

## 2014-12-25 NOTE — Progress Notes (Signed)
Pt's BP elevated- J Rybicki CRNA made aware and of to continue

## 2014-12-25 NOTE — Patient Instructions (Signed)

## 2014-12-25 NOTE — Progress Notes (Signed)
A/ox3, pleased with MAC, report to RN 

## 2014-12-26 ENCOUNTER — Telehealth: Payer: Self-pay

## 2014-12-26 NOTE — Telephone Encounter (Signed)
  Follow up Call-  Call back number 12/25/2014  Post procedure Call Back phone  # [phone]  Permission to leave phone message Yes     Patient questions:  Do you have a fever, pain , or abdominal swelling? No. Pain Score  0 *  Have you tolerated food without any problems? Yes.    Have you been able to return to your normal activities? Yes.    Do you have any questions about your discharge instructions: Diet   No. Medications  No. Follow up visit  No.  Do you have questions or concerns about your Care? No.  Actions: * If pain score is 4 or above: No action needed, pain <4.

## 2015-01-04 ENCOUNTER — Encounter: Payer: Self-pay | Admitting: Gastroenterology

## 2015-02-24 IMAGING — CR DG CHEST 2V
2 series · 2 of 2 positions shown · non-contrast
Comparison: 02/03/2009.

CLINICAL DATA: 62-year-old male preoperative study for hernia
repair. Hypertension. Initial encounter.

EXAM:
CHEST  2 VIEW

[w chest pa]
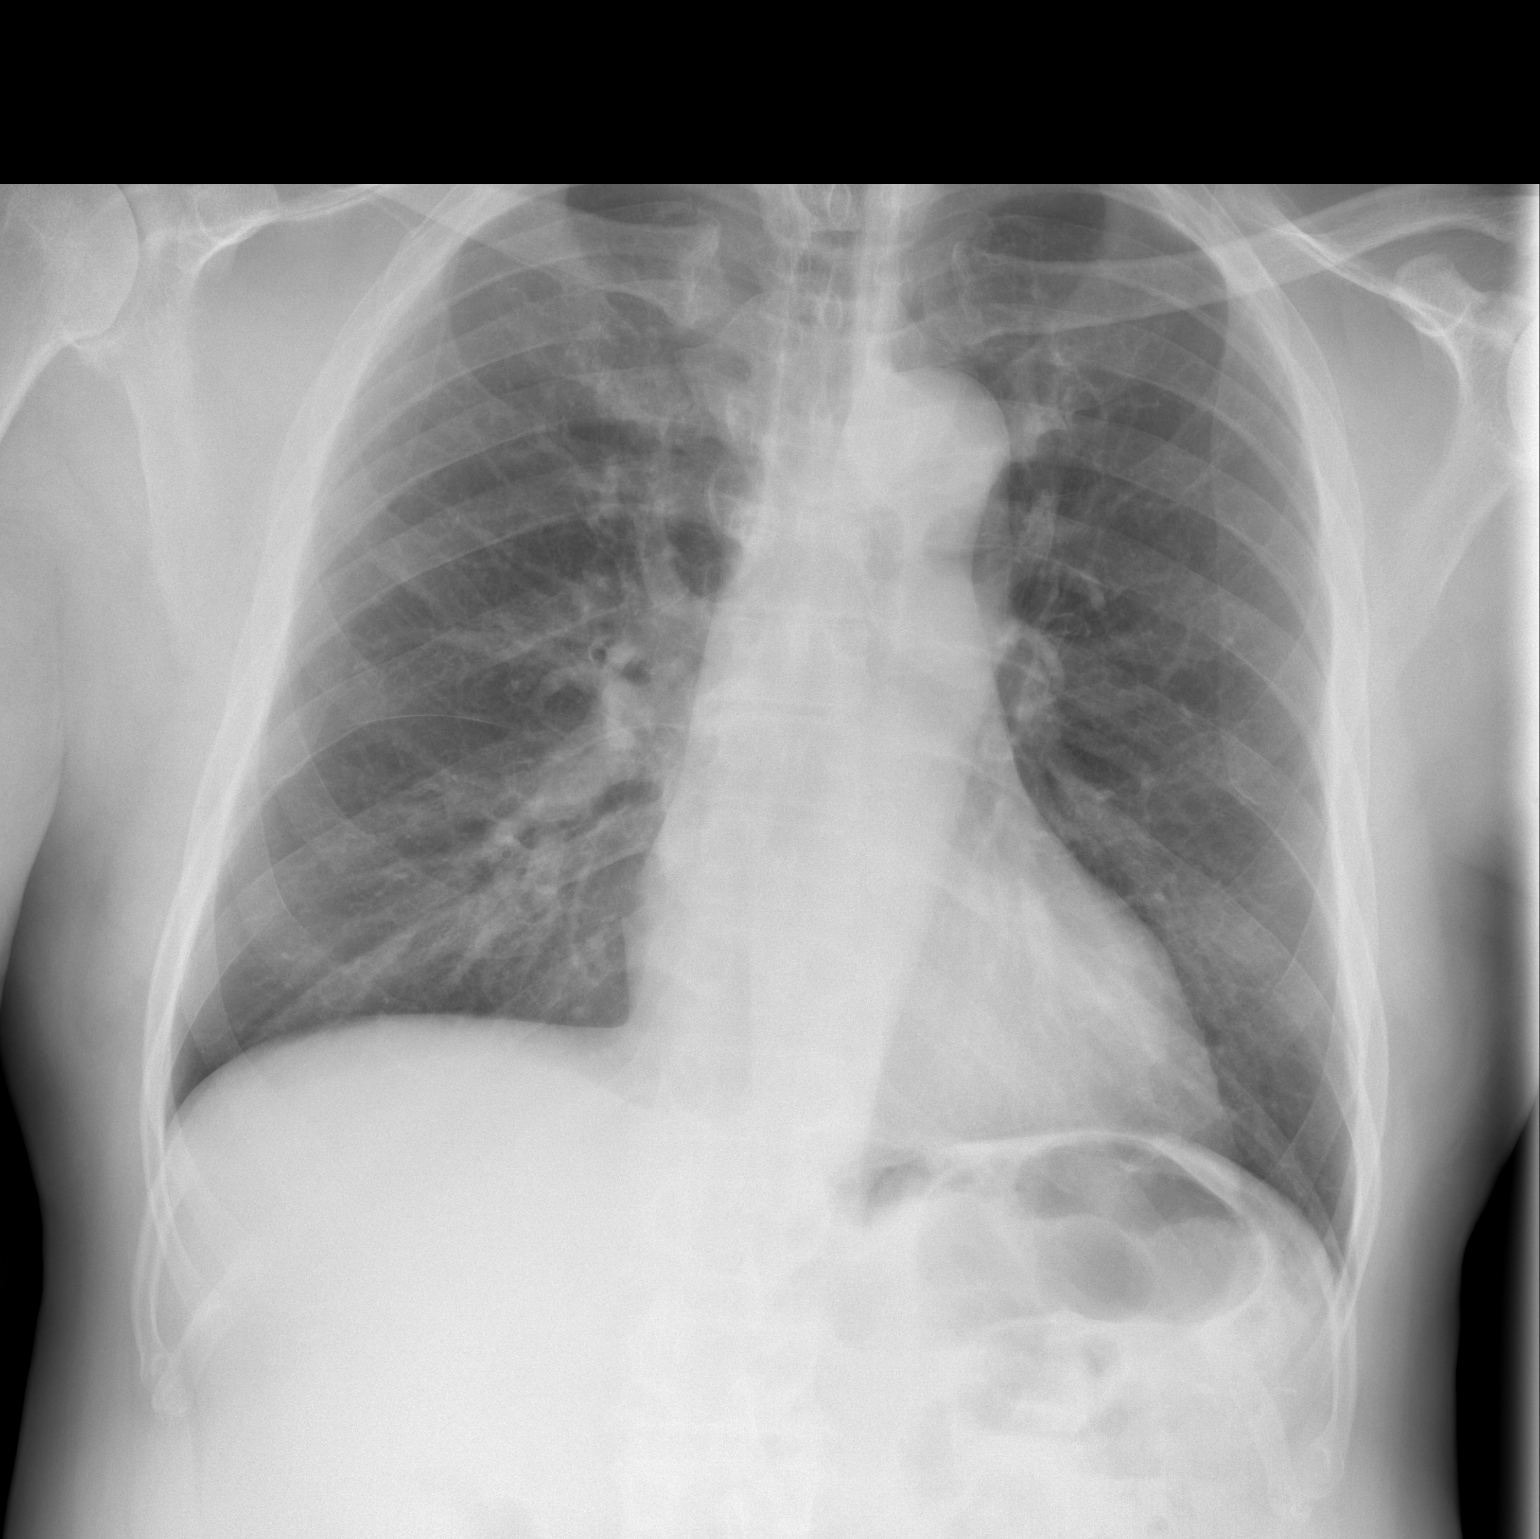

[w chest lat]
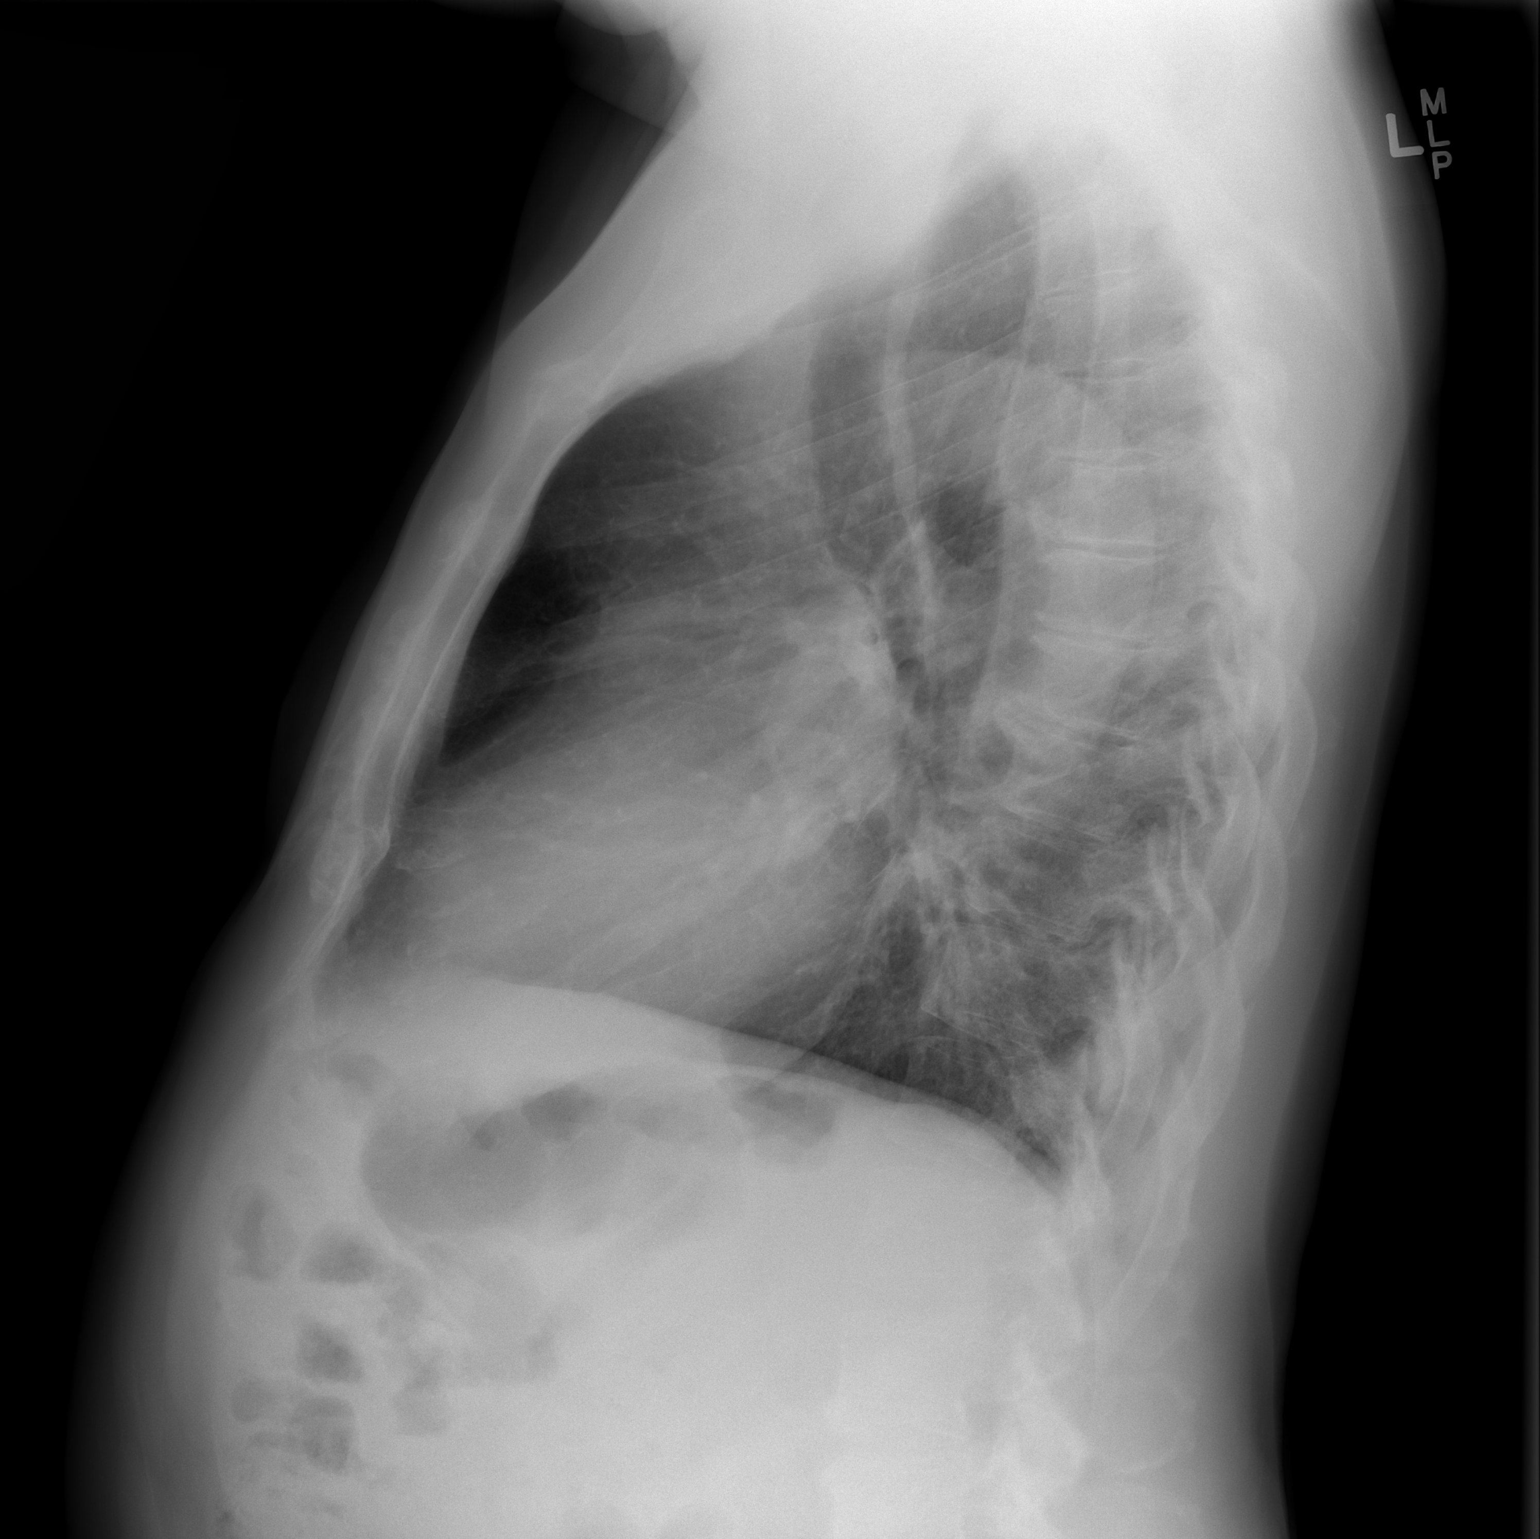

[2 of 2 positions shown; findings below may reference images not displayed]

FINDINGS: Lung volumes remain normal. Cardiac size at the upper limits of
normal. Other mediastinal contours are within normal limits. No
pneumothorax, pulmonary edema pleural effusion or confluent
pulmonary opacity. No acute osseous abnormality identified.
IMPRESSION: No acute cardiopulmonary abnormality.

## 2016-07-04 DIAGNOSIS — Z125 Encounter for screening for malignant neoplasm of prostate: Secondary | ICD-10-CM | POA: Diagnosis not present

## 2016-07-04 DIAGNOSIS — Z0000 Encounter for general adult medical examination without abnormal findings: Secondary | ICD-10-CM | POA: Diagnosis not present

## 2016-07-04 DIAGNOSIS — I10 Essential (primary) hypertension: Secondary | ICD-10-CM | POA: Diagnosis not present

## 2016-07-04 DIAGNOSIS — E7800 Pure hypercholesterolemia, unspecified: Secondary | ICD-10-CM | POA: Diagnosis not present

## 2017-01-27 HISTORY — PX: ELBOW SURGERY: SHX618

## 2017-02-25 DIAGNOSIS — Z8249 Family history of ischemic heart disease and other diseases of the circulatory system: Secondary | ICD-10-CM | POA: Diagnosis not present

## 2017-02-25 DIAGNOSIS — Z7982 Long term (current) use of aspirin: Secondary | ICD-10-CM | POA: Diagnosis not present

## 2017-02-25 DIAGNOSIS — M1990 Unspecified osteoarthritis, unspecified site: Secondary | ICD-10-CM | POA: Diagnosis not present

## 2017-02-25 DIAGNOSIS — E785 Hyperlipidemia, unspecified: Secondary | ICD-10-CM | POA: Diagnosis not present

## 2017-02-25 DIAGNOSIS — Z87891 Personal history of nicotine dependence: Secondary | ICD-10-CM | POA: Diagnosis not present

## 2017-02-25 DIAGNOSIS — Z809 Family history of malignant neoplasm, unspecified: Secondary | ICD-10-CM | POA: Diagnosis not present

## 2017-02-25 DIAGNOSIS — Z791 Long term (current) use of non-steroidal anti-inflammatories (NSAID): Secondary | ICD-10-CM | POA: Diagnosis not present

## 2017-02-25 DIAGNOSIS — I10 Essential (primary) hypertension: Secondary | ICD-10-CM | POA: Diagnosis not present

## 2017-02-25 DIAGNOSIS — Z833 Family history of diabetes mellitus: Secondary | ICD-10-CM | POA: Diagnosis not present

## 2017-04-13 DIAGNOSIS — R200 Anesthesia of skin: Secondary | ICD-10-CM | POA: Diagnosis not present

## 2017-04-20 DIAGNOSIS — G5620 Lesion of ulnar nerve, unspecified upper limb: Secondary | ICD-10-CM | POA: Diagnosis not present

## 2017-04-20 DIAGNOSIS — G629 Polyneuropathy, unspecified: Secondary | ICD-10-CM | POA: Diagnosis not present

## 2017-05-07 ENCOUNTER — Emergency Department (HOSPITAL_COMMUNITY)
Admission: EM | Admit: 2017-05-07 | Discharge: 2017-05-07 | Disposition: A | Discharge status: Home / Self Care | Payer: Medicare HMO | Attending: Emergency Medicine | Admitting: Emergency Medicine

## 2017-05-07 ENCOUNTER — Encounter (HOSPITAL_COMMUNITY): Payer: Self-pay | Admitting: Emergency Medicine

## 2017-05-07 ENCOUNTER — Other Ambulatory Visit: Payer: Self-pay

## 2017-05-07 DIAGNOSIS — R200 Anesthesia of skin: Secondary | ICD-10-CM | POA: Insufficient documentation

## 2017-05-07 DIAGNOSIS — Z5321 Procedure and treatment not carried out due to patient leaving prior to being seen by health care provider: Secondary | ICD-10-CM | POA: Diagnosis not present

## 2017-05-07 NOTE — ED Triage Notes (Signed)
Patient reports L arm numbness that started 3 weeks ago. Patient states he has numbness to his forearm down to his hand. Patient was seen at the headache center in [place] and checked 2 weeks ago. Patient has full motion in the hand but c/o reduced sensation. Hx of carpal tunnel.

## 2017-05-07 NOTE — ED Notes (Signed)
05/07/2017, Attempted follow-up call, no answer.

## 2017-05-11 DIAGNOSIS — G5602 Carpal tunnel syndrome, left upper limb: Secondary | ICD-10-CM | POA: Diagnosis not present

## 2017-05-11 DIAGNOSIS — G5622 Lesion of ulnar nerve, left upper limb: Secondary | ICD-10-CM | POA: Diagnosis not present

## 2017-05-14 DIAGNOSIS — G5622 Lesion of ulnar nerve, left upper limb: Secondary | ICD-10-CM | POA: Diagnosis not present

## 2017-06-01 DIAGNOSIS — G5622 Lesion of ulnar nerve, left upper limb: Secondary | ICD-10-CM | POA: Diagnosis not present

## 2017-06-09 DIAGNOSIS — G5622 Lesion of ulnar nerve, left upper limb: Secondary | ICD-10-CM | POA: Diagnosis not present

## 2017-06-30 DIAGNOSIS — G5622 Lesion of ulnar nerve, left upper limb: Secondary | ICD-10-CM | POA: Diagnosis not present

## 2017-07-02 DIAGNOSIS — M25562 Pain in left knee: Secondary | ICD-10-CM | POA: Diagnosis not present

## 2017-07-13 DIAGNOSIS — Z1389 Encounter for screening for other disorder: Secondary | ICD-10-CM | POA: Diagnosis not present

## 2017-07-13 DIAGNOSIS — Z0000 Encounter for general adult medical examination without abnormal findings: Secondary | ICD-10-CM | POA: Diagnosis not present

## 2017-07-13 DIAGNOSIS — G5602 Carpal tunnel syndrome, left upper limb: Secondary | ICD-10-CM | POA: Diagnosis not present

## 2017-07-13 DIAGNOSIS — E7800 Pure hypercholesterolemia, unspecified: Secondary | ICD-10-CM | POA: Diagnosis not present

## 2017-07-13 DIAGNOSIS — Z125 Encounter for screening for malignant neoplasm of prostate: Secondary | ICD-10-CM | POA: Diagnosis not present

## 2017-07-13 DIAGNOSIS — I10 Essential (primary) hypertension: Secondary | ICD-10-CM | POA: Diagnosis not present

## 2017-07-28 DIAGNOSIS — G5622 Lesion of ulnar nerve, left upper limb: Secondary | ICD-10-CM | POA: Diagnosis not present

## 2017-08-31 DIAGNOSIS — H401132 Primary open-angle glaucoma, bilateral, moderate stage: Secondary | ICD-10-CM | POA: Diagnosis not present

## 2017-09-07 DIAGNOSIS — M545 Low back pain: Secondary | ICD-10-CM | POA: Diagnosis not present

## 2017-09-07 DIAGNOSIS — I10 Essential (primary) hypertension: Secondary | ICD-10-CM | POA: Diagnosis not present

## 2017-09-14 DIAGNOSIS — H2513 Age-related nuclear cataract, bilateral: Secondary | ICD-10-CM | POA: Diagnosis not present

## 2017-09-14 DIAGNOSIS — H401132 Primary open-angle glaucoma, bilateral, moderate stage: Secondary | ICD-10-CM | POA: Diagnosis not present

## 2017-09-25 DIAGNOSIS — H401132 Primary open-angle glaucoma, bilateral, moderate stage: Secondary | ICD-10-CM | POA: Diagnosis not present

## 2017-09-25 DIAGNOSIS — H2512 Age-related nuclear cataract, left eye: Secondary | ICD-10-CM | POA: Diagnosis not present

## 2017-09-25 DIAGNOSIS — H2513 Age-related nuclear cataract, bilateral: Secondary | ICD-10-CM | POA: Diagnosis not present

## 2017-10-29 DIAGNOSIS — H401122 Primary open-angle glaucoma, left eye, moderate stage: Secondary | ICD-10-CM | POA: Diagnosis not present

## 2017-10-29 DIAGNOSIS — H2512 Age-related nuclear cataract, left eye: Secondary | ICD-10-CM | POA: Diagnosis not present

## 2017-10-30 DIAGNOSIS — H2511 Age-related nuclear cataract, right eye: Secondary | ICD-10-CM | POA: Diagnosis not present

## 2018-01-25 DIAGNOSIS — I10 Essential (primary) hypertension: Secondary | ICD-10-CM | POA: Diagnosis not present

## 2018-01-25 DIAGNOSIS — G5622 Lesion of ulnar nerve, left upper limb: Secondary | ICD-10-CM | POA: Diagnosis not present

## 2018-01-25 DIAGNOSIS — E7800 Pure hypercholesterolemia, unspecified: Secondary | ICD-10-CM | POA: Diagnosis not present

## 2018-06-18 DIAGNOSIS — H401132 Primary open-angle glaucoma, bilateral, moderate stage: Secondary | ICD-10-CM | POA: Diagnosis not present

## 2018-06-18 DIAGNOSIS — H2511 Age-related nuclear cataract, right eye: Secondary | ICD-10-CM | POA: Diagnosis not present

## 2018-07-08 DIAGNOSIS — H2511 Age-related nuclear cataract, right eye: Secondary | ICD-10-CM | POA: Diagnosis not present

## 2018-07-08 DIAGNOSIS — H401112 Primary open-angle glaucoma, right eye, moderate stage: Secondary | ICD-10-CM | POA: Diagnosis not present

## 2018-07-08 DIAGNOSIS — H401111 Primary open-angle glaucoma, right eye, mild stage: Secondary | ICD-10-CM | POA: Diagnosis not present

## 2018-08-17 DIAGNOSIS — Z5181 Encounter for therapeutic drug level monitoring: Secondary | ICD-10-CM | POA: Diagnosis not present

## 2018-08-17 DIAGNOSIS — M159 Polyosteoarthritis, unspecified: Secondary | ICD-10-CM | POA: Diagnosis not present

## 2018-08-17 DIAGNOSIS — I10 Essential (primary) hypertension: Secondary | ICD-10-CM | POA: Diagnosis not present

## 2018-08-17 DIAGNOSIS — E7800 Pure hypercholesterolemia, unspecified: Secondary | ICD-10-CM | POA: Diagnosis not present

## 2018-08-18 DIAGNOSIS — I10 Essential (primary) hypertension: Secondary | ICD-10-CM | POA: Diagnosis not present

## 2018-08-18 DIAGNOSIS — E7800 Pure hypercholesterolemia, unspecified: Secondary | ICD-10-CM | POA: Diagnosis not present

## 2019-01-31 DIAGNOSIS — H401132 Primary open-angle glaucoma, bilateral, moderate stage: Secondary | ICD-10-CM | POA: Diagnosis not present

## 2019-01-31 DIAGNOSIS — Z961 Presence of intraocular lens: Secondary | ICD-10-CM | POA: Diagnosis not present

## 2019-02-17 DIAGNOSIS — Z0000 Encounter for general adult medical examination without abnormal findings: Secondary | ICD-10-CM | POA: Diagnosis not present

## 2019-02-17 DIAGNOSIS — M159 Polyosteoarthritis, unspecified: Secondary | ICD-10-CM | POA: Diagnosis not present

## 2019-02-17 DIAGNOSIS — E7800 Pure hypercholesterolemia, unspecified: Secondary | ICD-10-CM | POA: Diagnosis not present

## 2019-02-17 DIAGNOSIS — G5602 Carpal tunnel syndrome, left upper limb: Secondary | ICD-10-CM | POA: Diagnosis not present

## 2019-02-17 DIAGNOSIS — I10 Essential (primary) hypertension: Secondary | ICD-10-CM | POA: Diagnosis not present

## 2019-02-17 DIAGNOSIS — Z23 Encounter for immunization: Secondary | ICD-10-CM | POA: Diagnosis not present

## 2019-02-17 DIAGNOSIS — Z1389 Encounter for screening for other disorder: Secondary | ICD-10-CM | POA: Diagnosis not present

## 2019-02-17 DIAGNOSIS — Z125 Encounter for screening for malignant neoplasm of prostate: Secondary | ICD-10-CM | POA: Diagnosis not present

## 2019-03-20 ENCOUNTER — Ambulatory Visit: Payer: Medicare HMO

## 2019-03-21 ENCOUNTER — Ambulatory Visit: Payer: Medicare HMO | Attending: Internal Medicine

## 2019-03-21 DIAGNOSIS — Z23 Encounter for immunization: Secondary | ICD-10-CM | POA: Insufficient documentation

## 2019-03-21 NOTE — Progress Notes (Signed)
   Covid-19 Vaccination Clinic  Name:  Trevor Rangel    MRN: [id number] DOB: 05/31/1950  03/21/2019  [name] was observed post Covid-19 immunization for 15 minutes without incidence. He was provided with Vaccine Information Sheet and instruction to access the V-Safe system.   [name] was instructed to call 911 with any severe reactions post vaccine: Marland Kitchen Difficulty breathing  . Swelling of your face and throat  . A fast heartbeat  . A bad rash all over your body  . Dizziness and weakness    Immunizations Administered    Name Date Dose VIS Date Route   Pfizer COVID-19 Vaccine 03/21/2019 11:44 AM 0.3 mL 01/07/2019 Intramuscular   Manufacturer: [place]   Lot: [id number]   [place]: [id number]

## 2019-04-12 ENCOUNTER — Ambulatory Visit: Payer: Medicare HMO | Attending: Internal Medicine

## 2019-04-12 DIAGNOSIS — Z23 Encounter for immunization: Secondary | ICD-10-CM

## 2019-04-12 NOTE — Progress Notes (Signed)
   Covid-19 Vaccination Clinic  Name:  Trevor Rangel    MRN: [id number] DOB: 1950-03-10  04/12/2019  Trevor Rangel was observed post Covid-19 immunization for 15 minutes without incident. He was provided with Vaccine Information Sheet and instruction to access the V-Safe system.   Trevor Rangel was instructed to call 911 with any severe reactions post vaccine: Marland Kitchen Difficulty breathing  . Swelling of face and throat  . A fast heartbeat  . A bad rash all over body  . Dizziness and weakness   Immunizations Administered    Name Date Dose VIS Date Route   Pfizer COVID-19 Vaccine 04/12/2019 11:27 AM 0.3 mL 01/07/2019 Intramuscular   Manufacturer: [employer]   Lot: [id number]   [place]: [id number]

## 2019-06-01 ENCOUNTER — Ambulatory Visit
Admission: RE | Admit: 2019-06-01 | Discharge: 2019-06-01 | Disposition: A | Discharge status: Home / Self Care | Payer: Medicare HMO | Source: Ambulatory Visit | Attending: Internal Medicine | Admitting: Internal Medicine

## 2019-06-01 ENCOUNTER — Other Ambulatory Visit: Payer: Self-pay | Admitting: Internal Medicine

## 2019-06-01 DIAGNOSIS — M25562 Pain in left knee: Secondary | ICD-10-CM

## 2019-06-01 DIAGNOSIS — G8929 Other chronic pain: Secondary | ICD-10-CM | POA: Diagnosis not present

## 2019-06-01 DIAGNOSIS — M25462 Effusion, left knee: Secondary | ICD-10-CM | POA: Diagnosis not present

## 2019-06-01 DIAGNOSIS — M159 Polyosteoarthritis, unspecified: Secondary | ICD-10-CM | POA: Diagnosis not present

## 2019-07-04 DIAGNOSIS — M25462 Effusion, left knee: Secondary | ICD-10-CM | POA: Diagnosis not present

## 2019-07-04 DIAGNOSIS — M1712 Unilateral primary osteoarthritis, left knee: Secondary | ICD-10-CM | POA: Diagnosis not present

## 2019-08-18 DIAGNOSIS — I10 Essential (primary) hypertension: Secondary | ICD-10-CM | POA: Diagnosis not present

## 2019-08-18 DIAGNOSIS — Z131 Encounter for screening for diabetes mellitus: Secondary | ICD-10-CM | POA: Diagnosis not present

## 2019-08-18 DIAGNOSIS — M159 Polyosteoarthritis, unspecified: Secondary | ICD-10-CM | POA: Diagnosis not present

## 2019-08-18 DIAGNOSIS — E7800 Pure hypercholesterolemia, unspecified: Secondary | ICD-10-CM | POA: Diagnosis not present

## 2019-08-18 DIAGNOSIS — Z5181 Encounter for therapeutic drug level monitoring: Secondary | ICD-10-CM | POA: Diagnosis not present

## 2019-08-18 DIAGNOSIS — Z1211 Encounter for screening for malignant neoplasm of colon: Secondary | ICD-10-CM | POA: Diagnosis not present

## 2019-09-13 DIAGNOSIS — M25522 Pain in left elbow: Secondary | ICD-10-CM | POA: Diagnosis not present

## 2019-09-22 DIAGNOSIS — G5622 Lesion of ulnar nerve, left upper limb: Secondary | ICD-10-CM | POA: Diagnosis not present

## 2019-11-03 DIAGNOSIS — G5622 Lesion of ulnar nerve, left upper limb: Secondary | ICD-10-CM | POA: Diagnosis not present

## 2019-11-04 DIAGNOSIS — Z961 Presence of intraocular lens: Secondary | ICD-10-CM | POA: Diagnosis not present

## 2019-11-04 DIAGNOSIS — H401132 Primary open-angle glaucoma, bilateral, moderate stage: Secondary | ICD-10-CM | POA: Diagnosis not present

## 2019-11-04 DIAGNOSIS — I10 Essential (primary) hypertension: Secondary | ICD-10-CM | POA: Diagnosis not present

## 2019-11-24 ENCOUNTER — Encounter: Payer: Self-pay | Admitting: Gastroenterology

## 2019-12-06 DIAGNOSIS — G5622 Lesion of ulnar nerve, left upper limb: Secondary | ICD-10-CM | POA: Diagnosis not present

## 2020-01-03 DIAGNOSIS — G5622 Lesion of ulnar nerve, left upper limb: Secondary | ICD-10-CM | POA: Diagnosis not present

## 2020-01-10 ENCOUNTER — Ambulatory Visit (AMBULATORY_SURGERY_CENTER): Payer: Self-pay | Admitting: *Deleted

## 2020-01-10 ENCOUNTER — Other Ambulatory Visit: Payer: Self-pay

## 2020-01-10 VITALS — Ht 75.0 in | Wt 221.0 lb

## 2020-01-10 DIAGNOSIS — Z86010 Personal history of colonic polyps: Secondary | ICD-10-CM

## 2020-01-10 NOTE — Progress Notes (Signed)

## 2020-01-13 ENCOUNTER — Encounter: Payer: Self-pay | Admitting: Gastroenterology

## 2020-01-24 ENCOUNTER — Other Ambulatory Visit: Payer: Self-pay

## 2020-01-24 ENCOUNTER — Ambulatory Visit (AMBULATORY_SURGERY_CENTER): Payer: Medicare HMO | Admitting: Gastroenterology

## 2020-01-24 ENCOUNTER — Encounter: Payer: Self-pay | Admitting: Gastroenterology

## 2020-01-24 ENCOUNTER — Other Ambulatory Visit: Payer: Self-pay | Admitting: Gastroenterology

## 2020-01-24 VITALS — BP 117/82 | HR 68 | Temp 97.5°F | Resp 18 | Ht 75.0 in | Wt 221.0 lb

## 2020-01-24 DIAGNOSIS — Z1211 Encounter for screening for malignant neoplasm of colon: Secondary | ICD-10-CM | POA: Diagnosis not present

## 2020-01-24 DIAGNOSIS — D125 Benign neoplasm of sigmoid colon: Secondary | ICD-10-CM

## 2020-01-24 DIAGNOSIS — Z86010 Personal history of colonic polyps: Secondary | ICD-10-CM | POA: Diagnosis not present

## 2020-01-24 DIAGNOSIS — K635 Polyp of colon: Secondary | ICD-10-CM

## 2020-01-24 MED ORDER — SODIUM CHLORIDE 0.9 % IV SOLN: 500.0000 mL | Freq: Once | INTRAVENOUS | Status: DC

## 2020-01-24 NOTE — Progress Notes (Signed)
To PACU, VSS. Report to [email or web address] 

## 2020-01-24 NOTE — Progress Notes (Signed)
Pt's states no medical or surgical changes since previsit or office visit. 

## 2020-01-24 NOTE — Progress Notes (Signed)
Called to room to assist during endoscopic procedure.  Patient ID and intended procedure confirmed with present staff. Received instructions for my participation in the procedure from the performing physician.  

## 2020-01-24 NOTE — Patient Instructions (Signed)
HANDOUTS PROVIDED ON: Polyps  The polyps removed today have been sent for pathology.  The results can take 1-3 weeks to receive.  When your next colonoscopy should occur will be based on the pathology results.    You may resume your previous diet and medication schedule.  Thank you for allowing us to care for you today!!!   YOU HAD AN ENDOSCOPIC PROCEDURE TODAY AT THE  ENDOSCOPY CENTER:   Refer to the procedure report that was given to you for any specific questions about what was found during the examination.  If the procedure report does not answer your questions, please call your gastroenterologist to clarify.  If you requested that your care partner not be given the details of your procedure findings, then the procedure report has been included in a sealed envelope for you to review at your convenience later.  YOU SHOULD EXPECT: Some feelings of bloating in the abdomen. Passage of more gas than usual.  Walking can help get rid of the air that was put into your GI tract during the procedure and reduce the bloating. If you had a lower endoscopy (such as a colonoscopy or flexible sigmoidoscopy) you may notice spotting of blood in your stool or on the toilet paper. If you underwent a bowel prep for your procedure, you may not have a normal bowel movement for a few days.  Please Note:  You might notice some irritation and congestion in your nose or some drainage.  This is from the oxygen used during your procedure.  There is no need for concern and it should clear up in a day or so.  SYMPTOMS TO REPORT IMMEDIATELY:   Following lower endoscopy (colonoscopy or flexible sigmoidoscopy):  Excessive amounts of blood in the stool  Significant tenderness or worsening of abdominal pains  Swelling of the abdomen that is new, acute  Fever of 100F or higher   For urgent or emergent issues, a gastroenterologist can be reached at any hour by calling [phone]. Do not use MyChart messaging for  urgent concerns.    DIET:  We do recommend a small meal at first, but then you may proceed to your regular diet.  Drink plenty of fluids but you should avoid alcoholic beverages for 24 hours.  ACTIVITY:  You should plan to take it easy for the rest of today and you should NOT DRIVE or use heavy machinery until tomorrow (because of the sedation medicines used during the test).    FOLLOW UP: Our staff will call the number listed on your records 48-72 hours following your procedure to check on you and address any questions or concerns that you may have regarding the information given to you following your procedure. If we do not reach you, we will leave a message.  We will attempt to reach you two times.  During this call, we will ask if you have developed any symptoms of COVID 19. If you develop any symptoms (ie: fever, flu-like symptoms, shortness of breath, cough etc.) before then, please call [phone].  If you test positive for Covid 19 in the 2 weeks post procedure, please call and report this information to us.    If any biopsies were taken you will be contacted by phone or by letter within the next 1-3 weeks.  Please call us at [phone] if you have not heard about the biopsies in 3 weeks.    SIGNATURES/CONFIDENTIALITY: You and/or your care partner have signed paperwork which will be entered   into your electronic medical record.  These signatures attest to the fact that that the information above on your After Visit Summary has been reviewed and is understood.  Full responsibility of the confidentiality of this discharge information lies with you and/or your care-partner.

## 2020-01-24 NOTE — Op Note (Signed)
[facility] Patient Name: Trevor Rangel Procedure Date: 01/24/2020 1:20 PM MRN: [id number] Endoscopist: Milus Banister , MD Age: 69 Referring MD:  Date of Birth: 10/03/1950 Gender: Male Account #: [id number] Procedure:                Colonoscopy Indications:              High risk colon cancer surveillance: Personal                            history of colonic polyps; Colonoscopy 2004 normal                            per patient. Colonoscopy 2016 single subCM polyp                            removed, not retrieved Medicines:                Monitored Anesthesia Care Procedure:                Pre-Anesthesia Assessment:                           - Prior to the procedure, a History and Physical                            was performed, and patient medications and                            allergies were reviewed. The patient's tolerance of                            previous anesthesia was also reviewed. The risks                            and benefits of the procedure and the sedation                            options and risks were discussed with the patient.                            All questions were answered, and informed consent                            was obtained. Prior Anticoagulants: The patient has                            taken no previous anticoagulant or antiplatelet                            agents. ASA Grade Assessment: II - A patient with                            mild systemic disease. After reviewing the risks  and benefits, the patient was deemed in                            satisfactory condition to undergo the procedure.                           After obtaining informed consent, the colonoscope                            was passed under direct vision. Throughout the                            procedure, the patient's blood pressure, pulse, and                            oxygen saturations were monitored  continuously. The                            Olympus [id number]) Colonoscope was                            introduced through the anus and advanced to the the                            cecum, identified by appendiceal orifice and                            ileocecal valve. The colonoscopy was performed                            without difficulty. The patient tolerated the                            procedure well. The quality of the bowel                            preparation was good. The ileocecal valve,                            appendiceal orifice, and rectum were photographed. Scope In: 1:25:12 PM Scope Out: 1:39:29 PM Scope Withdrawal Time: 0 hours 6 minutes 29 seconds  Total Procedure Duration: 0 hours 14 minutes 17 seconds  Findings:                 A 4 mm polyp was found in the sigmoid colon. The                            polyp was sessile. The polyp was removed with a                            cold snare. Resection and retrieval were complete.                           The exam was otherwise without abnormality on  direct and retroflexion views. Complications:            No immediate complications. Estimated blood loss:                            None. Estimated Blood Loss:     Estimated blood loss: none. Impression:               - One 4 mm polyp in the sigmoid colon, removed with                            a cold snare. Resected and retrieved.                           - The examination was otherwise normal on direct                            and retroflexion views. Recommendation:           - Patient has a contact number available for                            emergencies. The signs and symptoms of potential                            delayed complications were discussed with the                            patient. Return to normal activities tomorrow.                            Written discharge instructions were provided to the                             patient.                           - Resume previous diet.                           - Continue present medications.                           - Await pathology results. Rachael Fee, MD 01/24/2020 1:41:33 PM This report has been signed electronically.

## 2020-01-25 ENCOUNTER — Telehealth: Payer: Self-pay

## 2020-01-25 NOTE — Telephone Encounter (Signed)
  Follow up Call-  Call back number 01/24/2020  Post procedure Call Back phone  # [phone]  Permission to leave phone message Yes  Some recent data might be hidden     Patient questions:  Do you have a fever, pain , or abdominal swelling? No. Pain Score  0 *  Have you tolerated food without any problems? Yes.    Have you been able to return to your normal activities? Yes.    Do you have any questions about your discharge instructions: Diet   No. Medications  No. Follow up visit  No.  Do you have questions or concerns about your Care? No.  Actions: * If pain score is 4 or above: No action needed, pain <4.  1. Have you developed a fever since your procedure? no  2.   Have you had an respiratory symptoms (SOB or cough) since your procedure? no  3.   Have you tested positive for COVID 19 since your procedure no  4.   Have you had any family members/close contacts diagnosed with the COVID 19 since your procedure?  no   If yes to any of these questions please route to Laverna Peace, RN and Karlton Lemon, RN

## 2020-02-03 ENCOUNTER — Encounter: Payer: Self-pay | Admitting: Gastroenterology

## 2020-03-08 DIAGNOSIS — Z0000 Encounter for general adult medical examination without abnormal findings: Secondary | ICD-10-CM | POA: Diagnosis not present

## 2020-03-08 DIAGNOSIS — E7800 Pure hypercholesterolemia, unspecified: Secondary | ICD-10-CM | POA: Diagnosis not present

## 2020-03-08 DIAGNOSIS — Z1389 Encounter for screening for other disorder: Secondary | ICD-10-CM | POA: Diagnosis not present

## 2020-03-08 DIAGNOSIS — Z125 Encounter for screening for malignant neoplasm of prostate: Secondary | ICD-10-CM | POA: Diagnosis not present

## 2020-03-08 DIAGNOSIS — I10 Essential (primary) hypertension: Secondary | ICD-10-CM | POA: Diagnosis not present

## 2020-03-08 DIAGNOSIS — M159 Polyosteoarthritis, unspecified: Secondary | ICD-10-CM | POA: Diagnosis not present

## 2020-04-27 DIAGNOSIS — J301 Allergic rhinitis due to pollen: Secondary | ICD-10-CM | POA: Diagnosis not present

## 2020-04-27 DIAGNOSIS — M159 Polyosteoarthritis, unspecified: Secondary | ICD-10-CM | POA: Diagnosis not present

## 2020-08-30 DIAGNOSIS — M79642 Pain in left hand: Secondary | ICD-10-CM | POA: Diagnosis not present

## 2020-08-30 DIAGNOSIS — M19032 Primary osteoarthritis, left wrist: Secondary | ICD-10-CM | POA: Diagnosis not present

## 2020-08-30 DIAGNOSIS — R200 Anesthesia of skin: Secondary | ICD-10-CM | POA: Diagnosis not present

## 2020-08-30 DIAGNOSIS — R531 Weakness: Secondary | ICD-10-CM | POA: Diagnosis not present

## 2020-08-30 DIAGNOSIS — G5622 Lesion of ulnar nerve, left upper limb: Secondary | ICD-10-CM | POA: Diagnosis not present

## 2020-08-30 DIAGNOSIS — R202 Paresthesia of skin: Secondary | ICD-10-CM | POA: Diagnosis not present

## 2020-09-07 ENCOUNTER — Encounter (HOSPITAL_COMMUNITY): Payer: Self-pay | Admitting: *Deleted

## 2020-09-07 ENCOUNTER — Other Ambulatory Visit: Payer: Self-pay

## 2020-09-07 ENCOUNTER — Emergency Department (HOSPITAL_COMMUNITY)
Admission: EM | Admit: 2020-09-07 | Discharge: 2020-09-07 | Disposition: A | Discharge status: Home / Self Care | Payer: Medicare HMO | Attending: Emergency Medicine | Admitting: Emergency Medicine

## 2020-09-07 DIAGNOSIS — Z87891 Personal history of nicotine dependence: Secondary | ICD-10-CM | POA: Diagnosis not present

## 2020-09-07 DIAGNOSIS — Z79899 Other long term (current) drug therapy: Secondary | ICD-10-CM | POA: Diagnosis not present

## 2020-09-07 DIAGNOSIS — R519 Headache, unspecified: Secondary | ICD-10-CM | POA: Diagnosis present

## 2020-09-07 DIAGNOSIS — Z7982 Long term (current) use of aspirin: Secondary | ICD-10-CM | POA: Insufficient documentation

## 2020-09-07 DIAGNOSIS — I10 Essential (primary) hypertension: Secondary | ICD-10-CM | POA: Insufficient documentation

## 2020-09-07 DIAGNOSIS — U071 COVID-19: Secondary | ICD-10-CM | POA: Insufficient documentation

## 2020-09-07 DIAGNOSIS — J0100 Acute maxillary sinusitis, unspecified: Secondary | ICD-10-CM | POA: Insufficient documentation

## 2020-09-07 MED ORDER — DOXYCYCLINE HYCLATE 100 MG PO CAPS: 100.0000 mg | ORAL_CAPSULE | Freq: Two times a day (BID) | ORAL | 0 refills | Status: AC

## 2020-09-07 NOTE — ED Provider Notes (Signed)
[facility] EMERGENCY DEPARTMENT Provider Note   CSN: [id number] Arrival date & time: 09/07/20  1537     History Chief Complaint  Patient presents with   Facial Pain    + COVID home test    Trevor Rangel is a 70 y.o. male.  HPI  This patient is a 70 year old male history of frequent problems with upper respiratory infections and sinusitis.  Reports that he has been sick for 1 week which started out as sinus pressure and drainage followed by some body aches and fevers, it is gradually improving but he consistently has increasing amounts of drainage from his nose and pressure in his face.  He took a home positive test for COVID today.  He has been taking some over-the-counter medicines with some improvement, no nausea vomiting or diarrhea  Past Medical History:  Diagnosis Date   Allergy    BPH (benign prostatic hyperplasia)    Cataract    surgery   DDD (degenerative disc disease), lumbar    GERD (gastroesophageal reflux disease)    Hyperlipidemia    Hypertension    Sciatica     Patient Active Problem List   Diagnosis Date Noted   Groin hematoma - right 04/08/2013   Bilateral inguinal hernia (BIH) s/p lap repair w mesh 03/17/2013 12/15/2012   BPH (benign prostatic hyperplasia)     Past Surgical History:  Procedure Laterality Date   COLONOSCOPY  ~2004   "Normal" per patient   ELBOW SURGERY Left 2019   INGUINAL HERNIA REPAIR Left 05/04/2009   Dr Rise Patience   INGUINAL HERNIA REPAIR Bilateral 03/17/2013   Procedure: LAPAROSCOPIC EXPLORATION AND REPAIR OF BILATERAL INGUINAL HERNIAS;  Surgeon: [name], MD;  Location: [facility];  Service: General;  Laterality: Bilateral;   INSERTION OF MESH Bilateral 03/17/2013   Procedure: INSERTION OF MESH;  Surgeon: [name], MD;  Location: [facility];  Service: General;  Laterality: Bilateral;   KNEE SURGERY         Family History  Problem Relation Age of Onset   Heart disease Mother    Cancer Father        prostate   Colon  cancer Neg Hx    Esophageal cancer Neg Hx    Rectal cancer Neg Hx    Stomach cancer Neg Hx     Social History   Tobacco Use   Smoking status: Former    Types: Cigarettes    Quit date: 03/10/2010    Years since quitting: 10.5   Smokeless tobacco: Never  Vaping Use   Vaping Use: Never used  Substance Use Topics   Alcohol use: Yes    Alcohol/week: 12.0 standard drinks    Types: 12 Cans of beer per week   Drug use: No    Home Medications Prior to Admission medications   Medication Sig Start Date End Date Taking? Authorizing Provider  doxycycline (VIBRAMYCIN) 100 MG capsule Take 1 capsule (100 mg total) by mouth 2 (two) times daily for 7 days. 09/07/20 09/14/20 Yes Noemi Chapel, MD  amLODipine (NORVASC) 10 MG tablet Take 10 mg by mouth daily.  Patient not taking: Reported on 01/24/2020 05/10/13   [provider]  ASPIRIN 81 PO     [provider]  cloNIDine (CATAPRES) 0.1 MG tablet Take 0.1 mg by mouth daily.    [provider]  dorzolamide-timolol (COSOPT) 22.3-6.8 MG/ML ophthalmic solution SMARTSIG:In Eye(s) 01/02/20   [provider]  famotidine (PEPCID) 20 MG tablet Take 20 mg  by mouth 2 (two) times daily. Patient not taking: Reported on 01/24/2020    [provider]  furosemide (LASIX) 20 MG tablet Take 20 mg by mouth daily.  05/25/13   [provider]  hydrALAZINE (APRESOLINE) 25 MG tablet Take 25 mg by mouth daily.  06/03/13   [provider]  lisinopril (PRINIVIL,ZESTRIL) 20 MG tablet Take 20 mg by mouth daily.    [provider]  meloxicam (MOBIC) 15 MG tablet  11/11/19   [provider]  Menthol-Methyl Salicylate (MUSCLE RUB) 10-15 % CREA Apply 1 application topically 3 (three) times daily as needed for muscle pain.    [provider]  methylcellulose (ARTIFICIAL TEARS) 1 % ophthalmic solution Place 1 drop into both eyes 3 (three) times daily as needed (dry eyes).    [provider]   nortriptyline (PAMELOR) 25 MG capsule Take by mouth. 12/07/19   [provider]  ranitidine (ZANTAC) 150 MG tablet Take 150 mg by mouth daily.    [provider]  simvastatin (ZOCOR) 20 MG tablet Take 20 mg by mouth at bedtime.  05/25/13   [provider]    Allergies    Patient has no known allergies.  Review of Systems   Review of Systems  All other systems reviewed and are negative.  Physical Exam Updated Vital Signs BP (!) 124/91 (BP Location: Right Arm)   Pulse 82   Temp 98.9 F (37.2 C) (Oral)   Resp 16   Ht 1.905 m ('6\' 3"'$ )   Wt 97.5 kg   SpO2 96%   BMI 26.87 kg/m   Physical Exam Vitals and nursing note reviewed.  Constitutional:      General: He is not in acute distress.    Appearance: He is well-developed.  HENT:     Head: Normocephalic and atraumatic.     Nose: Rhinorrhea present. No congestion.     Comments: Nasal passages appear clear, there is some drainage    Mouth/Throat:     Mouth: Mucous membranes are moist.     Pharynx: No oropharyngeal exudate.  Eyes:     General: No scleral icterus.       Right eye: No discharge.        Left eye: No discharge.     Conjunctiva/sclera: Conjunctivae normal.     Pupils: Pupils are equal, round, and reactive to light.  Neck:     Thyroid: No thyromegaly.     Vascular: No JVD.  Cardiovascular:     Rate and Rhythm: Normal rate and regular rhythm.     Heart sounds: Normal heart sounds. No murmur heard.   No friction rub. No gallop.  Pulmonary:     Effort: Pulmonary effort is normal. No respiratory distress.     Breath sounds: Normal breath sounds. No wheezing or rales.  Abdominal:     General: Bowel sounds are normal. There is no distension.     Palpations: Abdomen is soft. There is no mass.     Tenderness: There is no abdominal tenderness.  Musculoskeletal:        General: No tenderness. Normal range of motion.     Cervical back: Normal range of motion and neck supple.   Lymphadenopathy:     Cervical: No cervical adenopathy.  Skin:    General: Skin is warm and dry.     Findings: No erythema or rash.  Neurological:     Mental Status: He is alert.     Coordination: Coordination normal.  Psychiatric:        Behavior: Behavior normal.    ED Results / Procedures / Treatments   Labs (all labs ordered are listed, but only abnormal results are displayed) Labs Reviewed - No data to display  EKG None  Radiology No results found.  Procedures Procedures   Medications Ordered in ED Medications - No data to display  ED Course  I have reviewed the triage vital signs and the nursing notes.  Pertinent labs & imaging results that were available during my care of the patient were reviewed by me and considered in my medical decision making (see chart for details).    MDM Rules/Calculators/A&P                           The patient is not in distress, he is COVID-positive but has no hypoxia tachycardia hypotension or fever.  He has had symptoms for 1 week and seems to have some progressively worsening sinusitis type symptoms, will give course of doxycycline but otherwise he can continue with supportive care.  He does not qualify for antivirals or monoclonal antibody therapy given 1 weeks of symptoms started.  The patient is agreeable to the plan and stable for discharge  Final Clinical Impression(s) / ED Diagnoses Final diagnoses:  COVID-19  Acute maxillary sinusitis, recurrence not specified    Rx / DC Orders ED Discharge Orders          Ordered    doxycycline (VIBRAMYCIN) 100 MG capsule  2 times daily        09/07/20 1634             Noemi Chapel, MD 09/07/20 1635

## 2020-09-07 NOTE — ED Triage Notes (Signed)
Pt with sinus pain several days, fatigue and mild SOB with exertion.  Denies any pain. Tested positive for covid at home today.

## 2020-09-07 NOTE — Discharge Instructions (Addendum)
Doxycycline '100mg'$  by mouth twice daily until the medicine is completely finished - this medicine is a strong antibiotic that treats certain infections.   Flonase daily Dayquil / nyquil ER for worsening symptoms.  Thank you for letting us take care of you today!  Please obtain all of your results from medical records or have your doctors office obtain the results - share them with your doctor - you should be seen at your doctors office in the next 2 days. Call today to arrange your follow up. Take the medications as prescribed. Please review all of the medicines and only take them if you do not have an allergy to them. Please be aware that if you are taking birth control pills, taking other prescriptions, ESPECIALLY ANTIBIOTICS may make the birth control ineffective - if this is the case, either do not engage in sexual activity or use alternative methods of birth control such as condoms until you have finished the medicine and your family doctor says it is OK to restart them. If you are on a blood thinner such as COUMADIN, be aware that any other medicine that you take may cause the coumadin to either work too much, or not enough - you should have your coumadin level rechecked in next 7 days if this is the case.  ?  It is also a possibility that you have an allergic reaction to any of the medicines that you have been prescribed - Everybody reacts differently to medications and while MOST people have no trouble with most medicines, you may have a reaction such as nausea, vomiting, rash, swelling, shortness of breath. If this is the case, please stop taking the medicine immediately and contact your physician.   If you were given a medication in the ED such as percocet, vicodin, or morphine, be aware that these medicines are sedating and may change your ability to take care of yourself adequately for several hours after being given this medicines - you should not drive or take care of small children if you  were given this medicine in the Emergency Department or if you have been prescribed these types of medicines. ?   You should return to the ER IMMEDIATELY if you develop severe or worsening symptoms.

## 2020-09-17 DIAGNOSIS — R600 Localized edema: Secondary | ICD-10-CM | POA: Diagnosis not present

## 2020-09-17 DIAGNOSIS — E7800 Pure hypercholesterolemia, unspecified: Secondary | ICD-10-CM | POA: Diagnosis not present

## 2020-09-17 DIAGNOSIS — I10 Essential (primary) hypertension: Secondary | ICD-10-CM | POA: Diagnosis not present

## 2020-09-17 DIAGNOSIS — E8770 Fluid overload, unspecified: Secondary | ICD-10-CM | POA: Diagnosis not present

## 2020-10-10 DIAGNOSIS — R200 Anesthesia of skin: Secondary | ICD-10-CM | POA: Diagnosis not present

## 2020-10-10 DIAGNOSIS — M25522 Pain in left elbow: Secondary | ICD-10-CM | POA: Diagnosis not present

## 2020-10-18 DIAGNOSIS — M19032 Primary osteoarthritis, left wrist: Secondary | ICD-10-CM | POA: Diagnosis not present

## 2020-10-18 DIAGNOSIS — G5622 Lesion of ulnar nerve, left upper limb: Secondary | ICD-10-CM | POA: Diagnosis not present

## 2020-11-02 DIAGNOSIS — H401132 Primary open-angle glaucoma, bilateral, moderate stage: Secondary | ICD-10-CM | POA: Diagnosis not present

## 2020-11-02 DIAGNOSIS — Z961 Presence of intraocular lens: Secondary | ICD-10-CM | POA: Diagnosis not present

## 2020-11-15 DIAGNOSIS — G5622 Lesion of ulnar nerve, left upper limb: Secondary | ICD-10-CM | POA: Diagnosis not present

## 2020-11-15 DIAGNOSIS — M19032 Primary osteoarthritis, left wrist: Secondary | ICD-10-CM | POA: Diagnosis not present

## 2020-11-15 DIAGNOSIS — M79642 Pain in left hand: Secondary | ICD-10-CM | POA: Diagnosis not present

## 2020-11-20 ENCOUNTER — Other Ambulatory Visit: Payer: Self-pay

## 2020-11-20 ENCOUNTER — Encounter: Payer: Self-pay | Admitting: Podiatry

## 2020-11-20 ENCOUNTER — Ambulatory Visit: Payer: Medicare HMO | Admitting: Podiatry

## 2020-11-20 DIAGNOSIS — M79676 Pain in unspecified toe(s): Secondary | ICD-10-CM | POA: Diagnosis not present

## 2020-11-20 DIAGNOSIS — D2372 Other benign neoplasm of skin of left lower limb, including hip: Secondary | ICD-10-CM | POA: Diagnosis not present

## 2020-11-20 DIAGNOSIS — M2042 Other hammer toe(s) (acquired), left foot: Secondary | ICD-10-CM

## 2020-11-20 DIAGNOSIS — B351 Tinea unguium: Secondary | ICD-10-CM | POA: Diagnosis not present

## 2020-11-20 NOTE — Progress Notes (Signed)
Subjective:  Patient ID: Trevor Rangel, male    DOB: [birth year],  MRN: [id number] HPI Chief Complaint  Patient presents with   Toe Pain    3rd toe left - callused tip x couple months, thought was an ingrown toenail, tried trimming-no help   New Patient (Initial Visit)    70 y.o. male presents with the above complaint.   ROS: Denies fever chills nausea vomiting muscle aches pains calf pain back pain chest pain shortness of breath.  Past Medical History:  Diagnosis Date   Allergy    BPH (benign prostatic hyperplasia)    Cataract    surgery   DDD (degenerative disc disease), lumbar    GERD (gastroesophageal reflux disease)    Hyperlipidemia    Hypertension    Sciatica    Past Surgical History:  Procedure Laterality Date   COLONOSCOPY  ~2004   "Normal" per patient   ELBOW SURGERY Left 2019   INGUINAL HERNIA REPAIR Left 05/04/2009   Dr Rise Patience   INGUINAL HERNIA REPAIR Bilateral 03/17/2013   Procedure: LAPAROSCOPIC EXPLORATION AND REPAIR OF BILATERAL INGUINAL HERNIAS;  Surgeon: [name], MD;  Location: [facility];  Service: General;  Laterality: Bilateral;   INSERTION OF MESH Bilateral 03/17/2013   Procedure: INSERTION OF MESH;  Surgeon: [name], MD;  Location: [facility];  Service: General;  Laterality: Bilateral;   KNEE SURGERY      Current Outpatient Medications:    amLODipine (NORVASC) 10 MG tablet, Take 10 mg by mouth daily.  (Patient not taking: Reported on 01/24/2020), Disp: , Rfl:    ASPIRIN 81 PO, , Disp: , Rfl:    cetirizine (ZYRTEC) 10 MG tablet, Take 10 mg by mouth daily., Disp: , Rfl:    cloNIDine (CATAPRES) 0.1 MG tablet, Take 0.1 mg by mouth daily., Disp: , Rfl:    dorzolamide-timolol (COSOPT) 22.3-6.8 MG/ML ophthalmic solution, SMARTSIG:In Eye(s), Disp: , Rfl:    famotidine (PEPCID) 20 MG tablet, Take 20 mg by mouth 2 (two) times daily. (Patient not taking: Reported on 01/24/2020), Disp: , Rfl:    furosemide (LASIX) 20 MG tablet, Take 20 mg by mouth  daily. , Disp: , Rfl:    hydrALAZINE (APRESOLINE) 25 MG tablet, Take 25 mg by mouth daily. , Disp: , Rfl:    lisinopril-hydrochlorothiazide (ZESTORETIC) 20-12.5 MG tablet, Take 1 tablet by mouth daily., Disp: , Rfl:    meloxicam (MOBIC) 15 MG tablet, , Disp: , Rfl:    Menthol-Methyl Salicylate (MUSCLE RUB) 10-15 % CREA, Apply 1 application topically 3 (three) times daily as needed for muscle pain., Disp: , Rfl:    methylcellulose (ARTIFICIAL TEARS) 1 % ophthalmic solution, Place 1 drop into both eyes 3 (three) times daily as needed (dry eyes)., Disp: , Rfl:    nortriptyline (PAMELOR) 25 MG capsule, Take by mouth., Disp: , Rfl:    ranitidine (ZANTAC) 150 MG tablet, Take 150 mg by mouth daily., Disp: , Rfl:    simvastatin (ZOCOR) 20 MG tablet, Take 20 mg by mouth at bedtime. , Disp: , Rfl:   No Known Allergies Review of Systems Objective:  There were no vitals filed for this visit.  General: Well developed, nourished, in no acute distress, alert and oriented x3   Dermatological: Skin is warm, dry and supple bilateral. Nails x 10 are well maintained; remaining integument appears unremarkable at this time. There are no open sores, no preulcerative lesions, no rash or signs of infection present.  Vascular: Dorsalis Pedis artery and Posterior  Tibial artery pedal pulses are 2/4 bilateral with immedate capillary fill time. Pedal hair growth present. No varicosities and no lower extremity edema present bilateral.   Neruologic: Grossly intact via light touch bilateral. Vibratory intact via tuning fork bilateral. Protective threshold with Semmes Wienstein monofilament intact to all pedal sites bilateral. Patellar and Achilles deep tendon reflexes 2+ bilateral. No Babinski or clonus noted bilateral.   Musculoskeletal: No gross boney pedal deformities bilateral. No pain, crepitus, or limitation noted with foot and ankle range of motion bilateral. Muscular strength 5/5 in all groups tested  bilateral.  Gait: Unassisted, Nonantalgic.    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  Painful elongated toenails 1 through 5 bilateral.  Plan: Debrided nails 1 through 5 bilateral.  Debrided painful benign skin lesion.  Placed silicone buttress pad beneath his toes to help stretch the toes.  I will follow-up with him in about 3 weeks for flexor tenotomy third digit left foot.     Dejanique Ruehl T. Bloomer, [place]

## 2020-11-30 DIAGNOSIS — M1712 Unilateral primary osteoarthritis, left knee: Secondary | ICD-10-CM | POA: Diagnosis not present

## 2020-11-30 DIAGNOSIS — M25462 Effusion, left knee: Secondary | ICD-10-CM | POA: Diagnosis not present

## 2020-12-11 ENCOUNTER — Ambulatory Visit: Payer: Medicare HMO | Admitting: Podiatry

## 2020-12-25 DIAGNOSIS — M79644 Pain in right finger(s): Secondary | ICD-10-CM | POA: Diagnosis not present

## 2021-01-01 DIAGNOSIS — R29898 Other symptoms and signs involving the musculoskeletal system: Secondary | ICD-10-CM | POA: Diagnosis not present

## 2021-01-08 ENCOUNTER — Ambulatory Visit: Payer: Medicare HMO | Admitting: Podiatry

## 2021-01-14 DIAGNOSIS — M79644 Pain in right finger(s): Secondary | ICD-10-CM | POA: Diagnosis not present

## 2021-02-07 ENCOUNTER — Encounter: Payer: Self-pay | Admitting: Podiatry

## 2021-02-07 ENCOUNTER — Ambulatory Visit (INDEPENDENT_AMBULATORY_CARE_PROVIDER_SITE_OTHER): Payer: Medicare HMO | Admitting: Podiatry

## 2021-02-07 ENCOUNTER — Other Ambulatory Visit: Payer: Self-pay

## 2021-02-07 DIAGNOSIS — M205X9 Other deformities of toe(s) (acquired), unspecified foot: Secondary | ICD-10-CM

## 2021-02-07 NOTE — Progress Notes (Signed)
He presents today for follow-up of his hammertoes hammertoes are doing much better he says now that we have been using the buttress pad.  He states that he does not think he needs a tenotomy to the third digit of the left foot.  He states that his other toes are starting greater on the right foot and is even scheduled for nerve conduction velocity exam on the right foot because he has pain that comes out of the sciatic and has burning on top of the foot and leg.  Objective: Vital signs are stable he is alert and oriented x3 pulses are palpable.  He has more rigid hammertoe deformities of the right foot and they appear to be arthritic at the PIPJ's.  He does have more flexibility of the toes on the left foot.  Assessment: Pain in limb secondary to hammertoe deformities.  Plan: Discussed the need for use of buttress pads at all times to help straighten these toes he understands and is amenable to it we will follow-up with me as needed.

## 2021-02-19 DIAGNOSIS — M79642 Pain in left hand: Secondary | ICD-10-CM | POA: Diagnosis not present

## 2021-02-19 DIAGNOSIS — G5622 Lesion of ulnar nerve, left upper limb: Secondary | ICD-10-CM | POA: Diagnosis not present

## 2021-02-19 DIAGNOSIS — M19032 Primary osteoarthritis, left wrist: Secondary | ICD-10-CM | POA: Diagnosis not present

## 2021-03-04 DIAGNOSIS — G629 Polyneuropathy, unspecified: Secondary | ICD-10-CM | POA: Diagnosis not present

## 2021-03-04 DIAGNOSIS — G5603 Carpal tunnel syndrome, bilateral upper limbs: Secondary | ICD-10-CM | POA: Diagnosis not present

## 2021-03-15 DIAGNOSIS — Z5181 Encounter for therapeutic drug level monitoring: Secondary | ICD-10-CM | POA: Diagnosis not present

## 2021-03-15 DIAGNOSIS — I10 Essential (primary) hypertension: Secondary | ICD-10-CM | POA: Diagnosis not present

## 2021-03-15 DIAGNOSIS — Z0000 Encounter for general adult medical examination without abnormal findings: Secondary | ICD-10-CM | POA: Diagnosis not present

## 2021-03-15 DIAGNOSIS — G629 Polyneuropathy, unspecified: Secondary | ICD-10-CM | POA: Diagnosis not present

## 2021-03-15 DIAGNOSIS — Z125 Encounter for screening for malignant neoplasm of prostate: Secondary | ICD-10-CM | POA: Diagnosis not present

## 2021-03-15 DIAGNOSIS — E7800 Pure hypercholesterolemia, unspecified: Secondary | ICD-10-CM | POA: Diagnosis not present

## 2021-03-20 ENCOUNTER — Encounter: Payer: Self-pay | Admitting: Neurology

## 2021-03-20 ENCOUNTER — Other Ambulatory Visit: Payer: Self-pay

## 2021-03-20 DIAGNOSIS — R202 Paresthesia of skin: Secondary | ICD-10-CM

## 2021-03-28 ENCOUNTER — Encounter: Payer: Self-pay | Admitting: Neurology

## 2021-03-28 ENCOUNTER — Other Ambulatory Visit (INDEPENDENT_AMBULATORY_CARE_PROVIDER_SITE_OTHER): Payer: Medicare HMO

## 2021-03-28 ENCOUNTER — Ambulatory Visit (INDEPENDENT_AMBULATORY_CARE_PROVIDER_SITE_OTHER): Payer: Medicare HMO | Admitting: Neurology

## 2021-03-28 ENCOUNTER — Other Ambulatory Visit: Payer: Self-pay

## 2021-03-28 DIAGNOSIS — G629 Polyneuropathy, unspecified: Secondary | ICD-10-CM

## 2021-03-28 DIAGNOSIS — R202 Paresthesia of skin: Secondary | ICD-10-CM

## 2021-03-28 LAB — VITAMIN B12: Vitamin B-12: 211 pg/mL (ref 211–911)

## 2021-03-28 LAB — FOLATE: Folate: 15.6 ng/mL (ref >5.9)

## 2021-03-28 NOTE — Progress Notes (Signed)
?[facility] ?Neurology Division ?Clinic Note - Initial Visit ? ? ?Date: 03/28/21 ? ?Trevor Rangel ?MRN: [id number] ?DOB: 11/17/1950 ? ? ?Dear Dr. Delfina Rangel: ? ?Thank you for your kind referral of Trevor Rangel for consultation of neuropathy. Although his history is well known to you, please allow [place] to reiterate it for the purpose of our medical record. The patient was accompanied to the clinic by self. ?  ? ?History of Present Illness: ?Trevor Rangel is a 71 y.o. right-handed male with GERD, hypertension, and hyperlipidemia presenting for evaluation of neuropathy.  ? ?Patient had a fall in December after his dog leash got caught in his legs and developed right foot weakness following this.  To evaluate for peroneal neuropathy, he was referred for EMG (performed elsewhere) of the legs which showed severe sensorimotor axonal polyneuropathy. He is here to discuss these findings.  Patient denies any numbness, tingling, or pain in the feet.  His right foot weakness has improved.  He has chronic low back pain and has received ESI in the past.  He has some imbalance, walks unassisted.  He is more bothered by his toes curling inwards on the right foot.  He saw podiatry who diagnosed him with hammertoes.  ? ?No personal history of diabetes.  He drinks 4-6 beers socially with his friends over the weekend.  No family history of neuropathy.  ? ? ?Past Medical History:  ?Diagnosis Date  ? Allergy   ? BPH (benign prostatic hyperplasia)   ? Cataract   ? surgery  ? DDD (degenerative disc disease), lumbar   ? GERD (gastroesophageal reflux disease)   ? Hyperlipidemia   ? Hypertension   ? Sciatica   ? ? ?Past Surgical History:  ?Procedure Laterality Date  ? COLONOSCOPY  ~2004  ? "Normal" per patient  ? ELBOW SURGERY Left 2019  ? INGUINAL HERNIA REPAIR Left 05/04/2009  ? Dr Rise Patience  ? INGUINAL HERNIA REPAIR Bilateral 03/17/2013  ? Procedure: LAPAROSCOPIC EXPLORATION AND REPAIR OF BILATERAL INGUINAL HERNIAS;  Surgeon: [name],  MD;  Location: [facility];  Service: General;  Laterality: Bilateral;  ? INSERTION OF MESH Bilateral 03/17/2013  ? Procedure: INSERTION OF MESH;  Surgeon: [name], MD;  Location: [facility];  Service: General;  Laterality: Bilateral;  ? KNEE SURGERY    ? ? ? ?Medications:  ?Outpatient Encounter Medications as of 03/28/2021  ?Medication Sig Note  ? amLODipine (NORVASC) 10 MG tablet Take 10 mg by mouth daily. 06/06/2013: Received from: External Pharmacy  ? ASPIRIN 81 PO    ? cetirizine (ZYRTEC) 10 MG tablet Take 10 mg by mouth daily.   ? cloNIDine (CATAPRES) 0.1 MG tablet Take 0.1 mg by mouth daily.   ? dorzolamide-timolol (COSOPT) 22.3-6.8 MG/ML ophthalmic solution SMARTSIG:In Eye(s)   ? famotidine (PEPCID) 20 MG tablet Take 20 mg by mouth 2 (two) times daily.   ? furosemide (LASIX) 20 MG tablet Take 20 mg by mouth daily.  06/06/2013: Received from: External Pharmacy  ? lisinopril-hydrochlorothiazide (ZESTORETIC) 20-12.5 MG tablet Take 1 tablet by mouth daily.   ? meloxicam (MOBIC) 15 MG tablet    ? Menthol-Methyl Salicylate (MUSCLE RUB) 10-15 % CREA Apply 1 application topically 3 (three) times daily as needed for muscle pain. 03/14/2013: Apply to hands as needed  ? methylcellulose (ARTIFICIAL TEARS) 1 % ophthalmic solution Place 1 drop into both eyes 3 (three) times daily as needed (dry eyes).   ? simvastatin (ZOCOR) 20 MG tablet Take 20 mg by  mouth at bedtime.  06/06/2013: Received from: External Pharmacy  ? ranitidine (ZANTAC) 150 MG tablet Take 150 mg by mouth daily. (Patient not taking: Reported on 03/28/2021)   ? [DISCONTINUED] cetirizine (ZYRTEC) 10 MG tablet Take 1 tablet by mouth daily.   ? [DISCONTINUED] hydrALAZINE (APRESOLINE) 25 MG tablet Take 25 mg by mouth daily.  06/06/2013: Received from: External Pharmacy  ? [DISCONTINUED] nortriptyline (PAMELOR) 25 MG capsule Take by mouth.   ? ?No facility-administered encounter medications on file as of 03/28/2021.  ? ? ?Allergies: No Known Allergies ? ?Family  History: ?Family History  ?Problem Relation Age of Onset  ? Heart disease Mother   ? Cancer Father   ?     prostate  ? Colon cancer Neg Hx   ? Esophageal cancer Neg Hx   ? Rectal cancer Neg Hx   ? Stomach cancer Neg Hx   ? ? ?Social History: ?Social History  ? ?Tobacco Use  ? Smoking status: Former  ?  Types: Cigarettes  ?  Quit date: 03/10/2010  ?  Years since quitting: 11.0  ? Smokeless tobacco: Never  ?Vaping Use  ? Vaping Use: Never used  ?Substance Use Topics  ? Alcohol use: Yes  ?  Alcohol/week: 12.0 standard drinks  ?  Types: 12 Cans of beer per week  ? Drug use: No  ? ?Social History  ? ?Social History Narrative  ? Right handed   ? Lives with girl friend   ? ? ?Vital Signs:  ?BP 131/81   Pulse 76   Ht 6\' 3"  (1.905 m)   Wt 218 lb 3.2 oz (99 kg)   SpO2 97%   BMI 27.27 kg/m?  ?  ?Neurological Exam: ?MENTAL STATUS including orientation to time, place, person, recent and remote memory, attention span and concentration, language, and fund of knowledge is normal.  Speech is not dysarthric. ? ?CRANIAL NERVES: ?II:  No visual field defects.   ?III-IV-VI: Pupils equal round and reactive to light.  Normal conjugate, extra-ocular eye movements in all directions of gaze.  No nystagmus.  No ptosis.   ?V:  Normal facial sensation.    ?VII:  Normal facial symmetry and movements.   ?VIII:  Normal hearing and vestibular function.   ?IX-X:  Normal palatal movement.   ?XI:  Normal shoulder shrug and head rotation.   ?XII:  Normal tongue strength and range of motion, no deviation or fasciculation. ? ?MOTOR:  No atrophy, fasciculations or abnormal movements.  No pronator drift.  ? ?Upper Extremity:  Right  Left  ?Deltoid  5/5   5/5   ?Biceps  5/5   5/5   ?Triceps  5/5   5/5   ?Infraspinatus 5/5  5/5  ?Medial pectoralis 5/5  5/5  ?Wrist extensors  5/5   5/5   ?Wrist flexors  5/5   5/5   ?Finger extensors  5/5   5/5   ?Finger flexors  5/5   5/5   ?Dorsal interossei  5/5   5/5   ?Abductor pollicis  5/5   5/5   ?Tone (Ashworth  scale)  0  0  ? ?Lower Extremity:  Right  Left  ?Hip flexors  5/5   5/5   ?Hip extensors  5/5   5/5   ?Adductor 5/5  5/5  ?Abductor 5/5  5/5  ?Knee flexors  5/5   5/5   ?Knee extensors  5/5   5/5   ?Dorsiflexors  5/5   5/5   ?Plantarflexors  5/5   5/5   ?Toe extensors  5/5   5/5   ?Toe flexors  5/5   5/5   ?Tone (Ashworth scale)  0  0  ? ?MSRs:  ?Right        Left                  ?brachioradialis 2+  2+  ?biceps 2+  2+  ?triceps 2+  2+  ?patellar 2+  2+  ?ankle jerk 2+  2+  ?Hoffman no  no  ?plantar response down  down  ? ?SENSORY:  Absent vibration at the ankles bilaterally.  Temperature and pin prick intact throughout.   Romberg's sign positive.  ? ?COORDINATION/GAIT: Normal finger-to- nose-finger.  Intact rapid alternating movements bilaterally.  Gait mildly wide-based and stable, unassisted.  Stressed gait intact, unsteady with tandem gait.  ? ? 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  Patient seems largely unaware of his symptom and denies numbness/tingling of the feet.  Exam does show reduced distal vibration and sensory ataxia. Distal strength is intact.  I had extensive discussion with the patient regarding the pathogenesis, etiology, management, and natural course of neuropathy. Neuropathy tends to be slowly progressive, especially if a treatable etiology is not identified.  I would like to test for treatable causes of neuropathy. I discussed that in the vast majority of cases, despite checking for reversible causes, we are unable to find the underlying etiology and management is symptomatic.   ? ?PLAN/RECOMMENDATIONS:  ?Check vitamin B12, folate, vitamin B1, copper, SPEP with IFE ?Patient educated on daily foot inspection, fall prevention, and safety precautions around the home. ?If balance gets worse, we can order PT for gait training ? ?Return to clinic as needed ? ? ?Thank you for allowing me to participate in patient's care.  If I can answer any  additional questions, I would be pleased to do so.   ? ?Sincerely, ? ? ? ?Kao Conry K. Posey Pronto, DO ? ?

## 2021-04-04 LAB — IMMUNOFIXATION ELECTROPHORESIS
IgG (Immunoglobin G), Serum: 1120 mg/dL (ref 600–1540)
IgM, Serum: 46 mg/dL — ABNORMAL LOW (ref 50–300)
Immunofix Electr Int: NOT DETECTED
Immunoglobulin A: 153 mg/dL (ref 70–320)

## 2021-04-04 LAB — CERULOPLASMIN: Ceruloplasmin: 23 mg/dL (ref 18–36)

## 2021-04-04 LAB — PROTEIN ELECTROPHORESIS, SERUM
Albumin ELP: 4.4 g/dL (ref 3.8–4.8)
Alpha 1: 0.3 g/dL (ref 0.2–0.3)
Alpha 2: 0.6 g/dL (ref 0.5–0.9)
Beta 2: 0.3 g/dL (ref 0.2–0.5)
Beta Globulin: 0.4 g/dL (ref 0.4–0.6)
Gamma Globulin: 1 g/dL (ref 0.8–1.7)
Total Protein: 7 g/dL (ref 6.1–8.1)

## 2021-04-04 LAB — VITAMIN B1: Vitamin B1 (Thiamine): 11 nmol/L (ref 8–30)

## 2021-04-18 ENCOUNTER — Encounter: Payer: Medicare HMO | Admitting: Neurology

## 2021-05-13 DIAGNOSIS — M1712 Unilateral primary osteoarthritis, left knee: Secondary | ICD-10-CM | POA: Diagnosis not present

## 2021-05-13 DIAGNOSIS — M25462 Effusion, left knee: Secondary | ICD-10-CM | POA: Diagnosis not present

## 2021-05-17 IMAGING — CR DG KNEE COMPLETE 4+V*L*
4 series · 4 of 4 positions shown · non-contrast
Comparison: 09/23/2009

CLINICAL DATA: Knee pain

EXAM:
LEFT KNEE - COMPLETE 4+ VIEW

[t knee ap left]
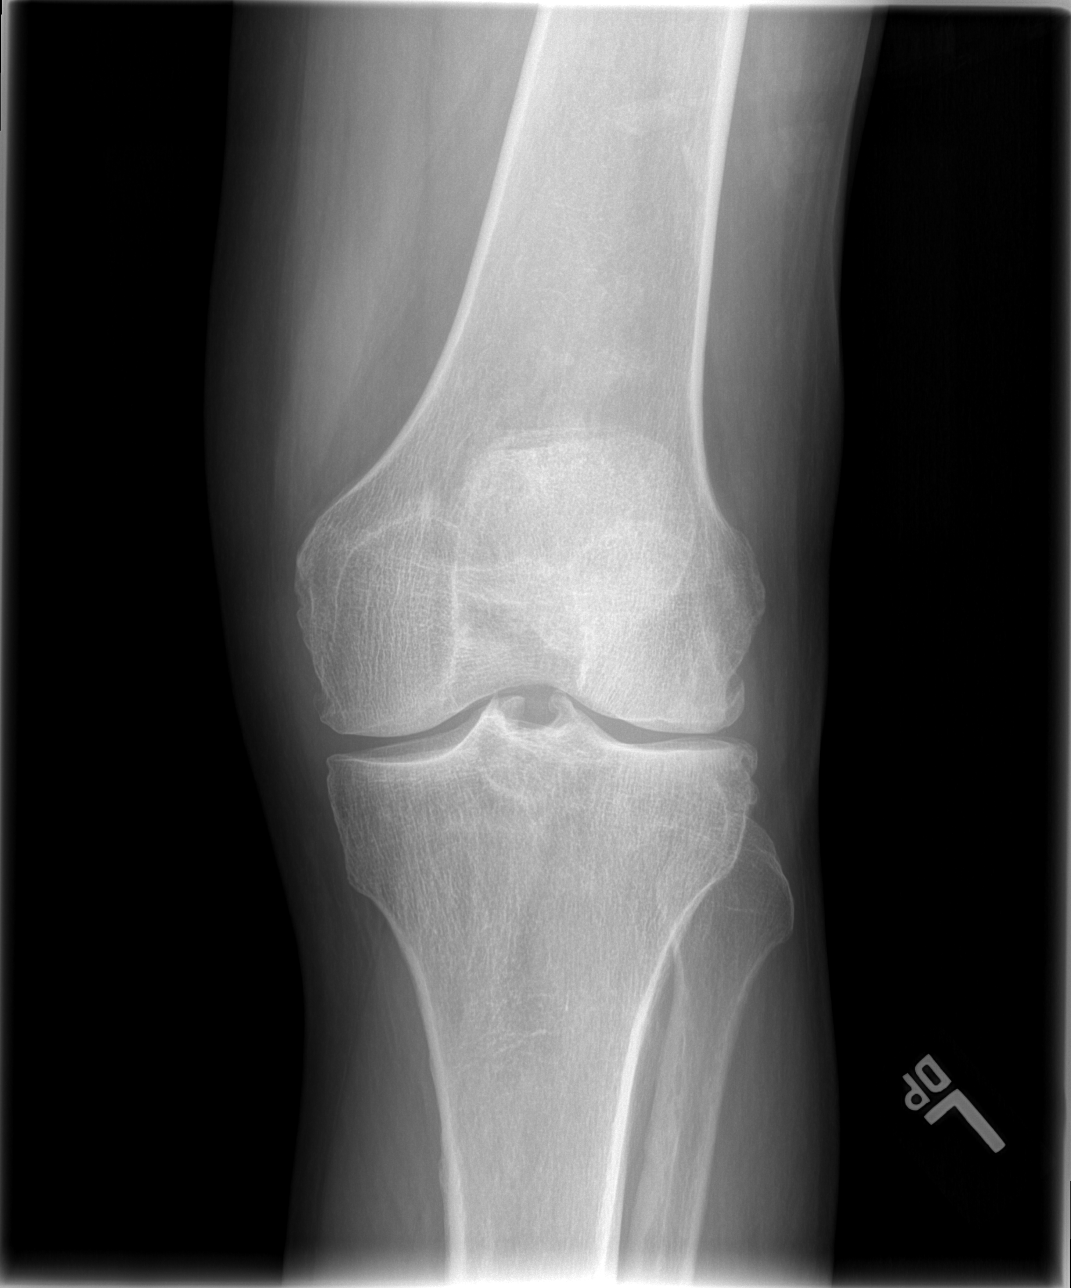

[t knee oblique left (1 of 2)]
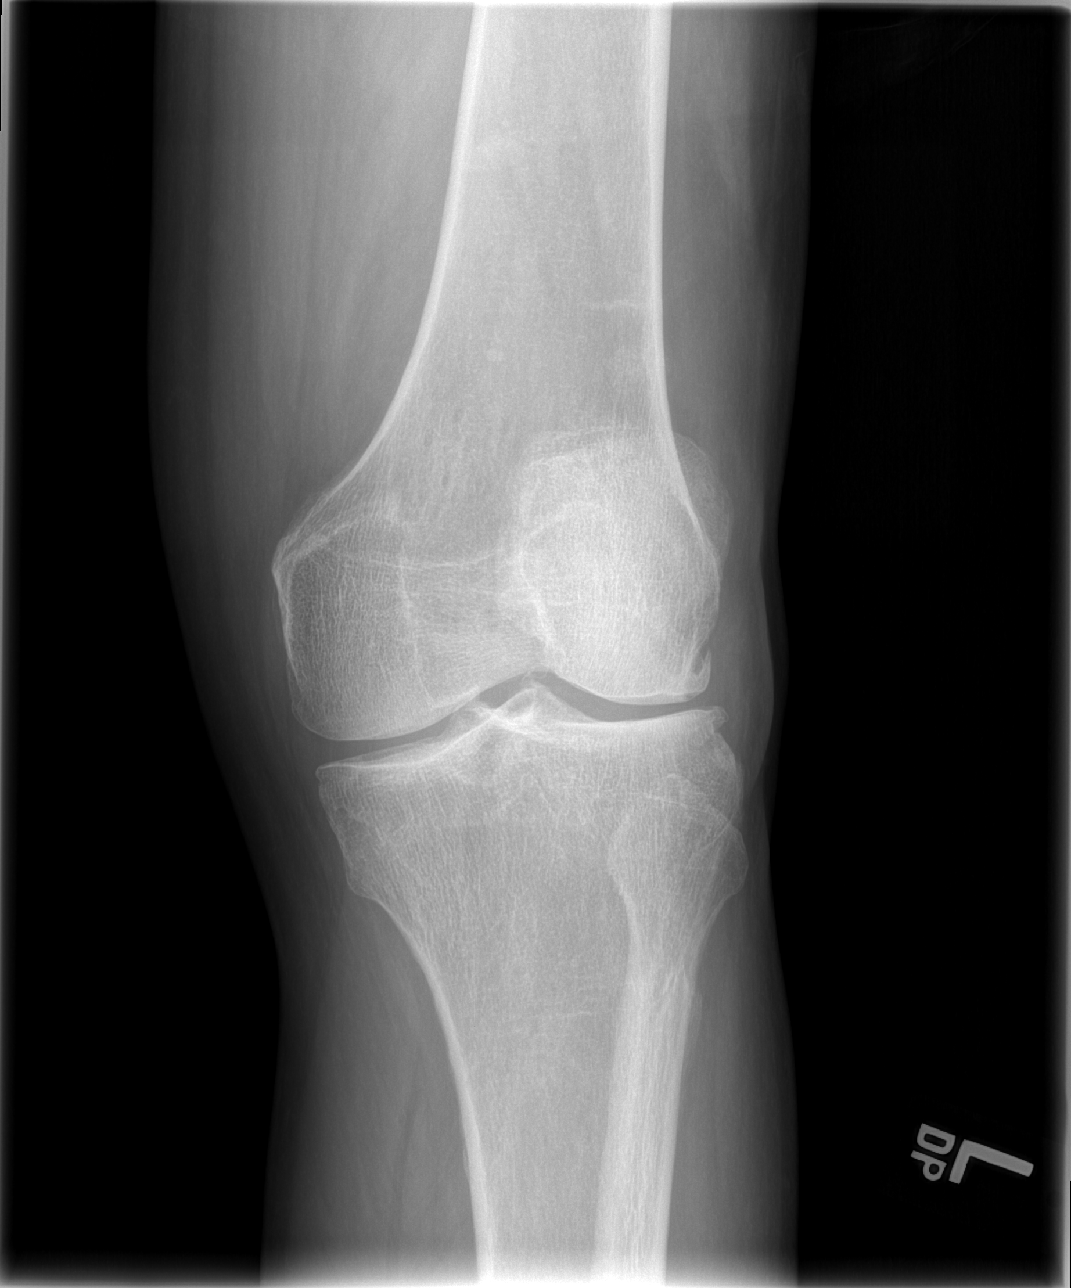

[t knee oblique left (2 of 2)]
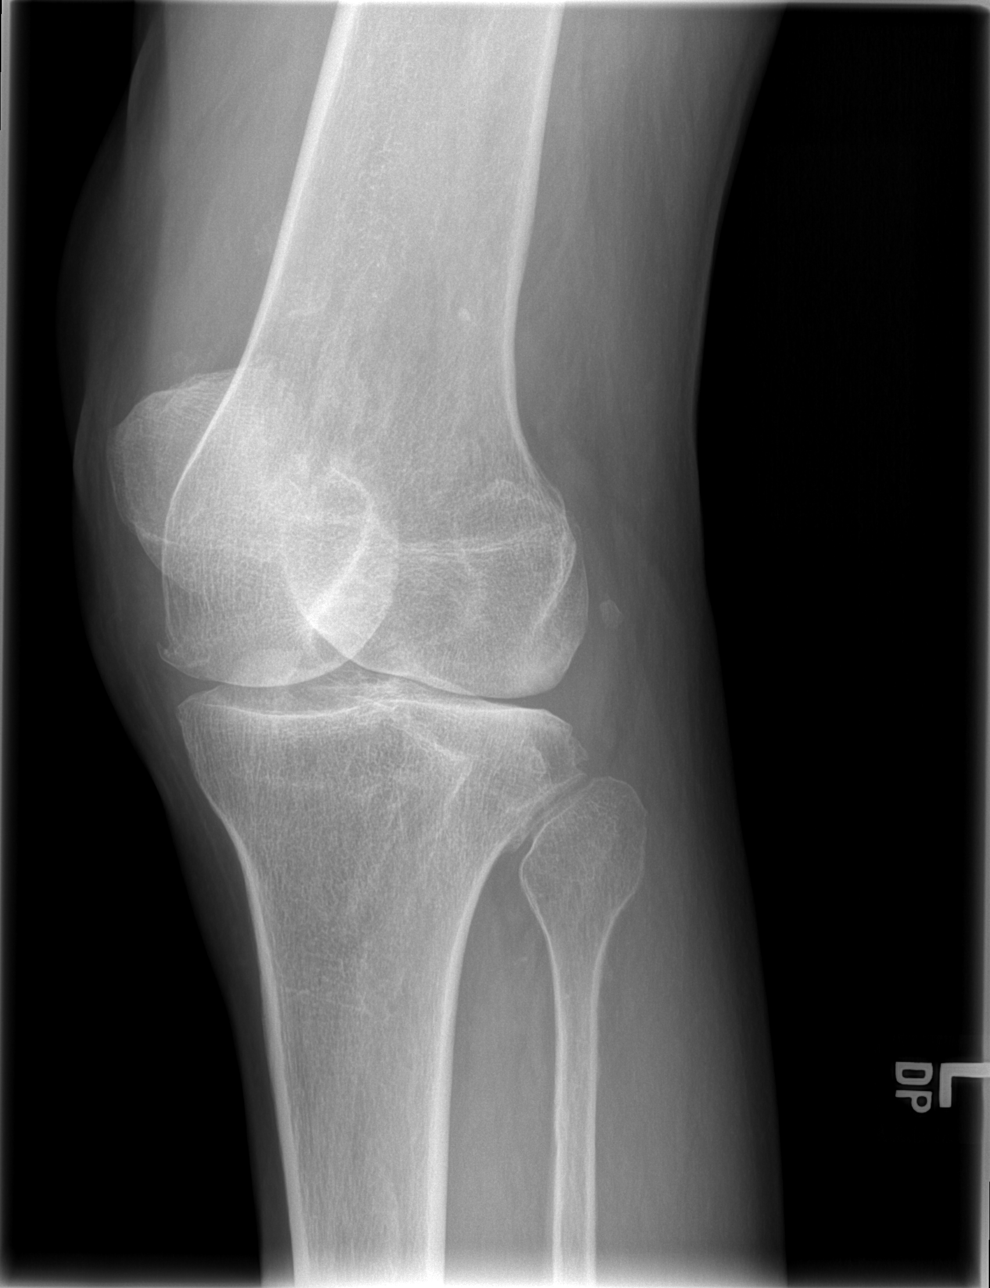

[t knee lat left]
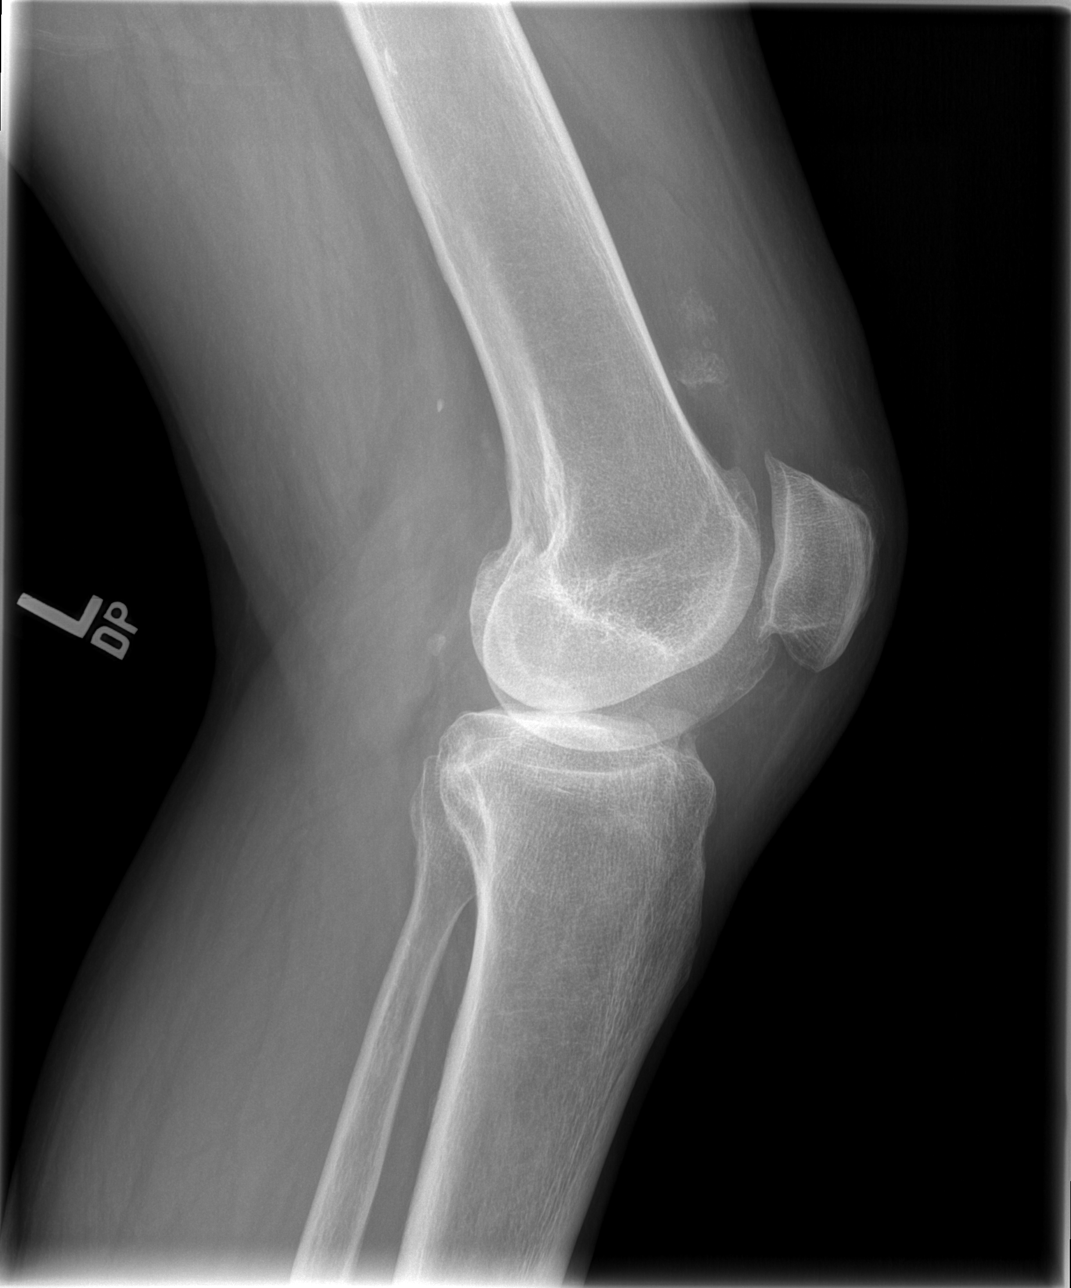

[4 of 4 positions shown; findings below may reference images not displayed]

FINDINGS: No fracture or malalignment. Mild tricompartment arthritis with
joint space narrowing and spurring. Moderate to large knee effusion.
Faintly calcified loose bodies at the suprapatellar joint.
IMPRESSION: 1. Mild tricompartment arthritis with moderate to large knee
effusion
2. Suprapatellar calcified loose bodies

## 2021-09-13 DIAGNOSIS — E7800 Pure hypercholesterolemia, unspecified: Secondary | ICD-10-CM | POA: Diagnosis not present

## 2021-09-13 DIAGNOSIS — G629 Polyneuropathy, unspecified: Secondary | ICD-10-CM | POA: Diagnosis not present

## 2021-09-13 DIAGNOSIS — I10 Essential (primary) hypertension: Secondary | ICD-10-CM | POA: Diagnosis not present

## 2021-11-04 DIAGNOSIS — H401132 Primary open-angle glaucoma, bilateral, moderate stage: Secondary | ICD-10-CM | POA: Diagnosis not present

## 2021-11-04 DIAGNOSIS — Z961 Presence of intraocular lens: Secondary | ICD-10-CM | POA: Diagnosis not present

## 2021-11-04 DIAGNOSIS — I10 Essential (primary) hypertension: Secondary | ICD-10-CM | POA: Diagnosis not present

## 2022-01-02 DIAGNOSIS — R0789 Other chest pain: Secondary | ICD-10-CM | POA: Diagnosis not present

## 2022-01-02 DIAGNOSIS — E8770 Fluid overload, unspecified: Secondary | ICD-10-CM | POA: Diagnosis not present

## 2022-01-02 DIAGNOSIS — R600 Localized edema: Secondary | ICD-10-CM | POA: Diagnosis not present

## 2022-01-03 ENCOUNTER — Other Ambulatory Visit: Payer: Self-pay | Admitting: Internal Medicine

## 2022-01-03 ENCOUNTER — Ambulatory Visit
Admission: RE | Admit: 2022-01-03 | Discharge: 2022-01-03 | Disposition: A | Discharge status: Home / Self Care | Payer: Medicare HMO | Source: Ambulatory Visit | Attending: Internal Medicine | Admitting: Internal Medicine

## 2022-01-03 DIAGNOSIS — R0789 Other chest pain: Secondary | ICD-10-CM

## 2022-01-03 DIAGNOSIS — R0781 Pleurodynia: Secondary | ICD-10-CM | POA: Diagnosis not present

## 2022-01-08 DIAGNOSIS — I10 Essential (primary) hypertension: Secondary | ICD-10-CM | POA: Diagnosis not present

## 2022-01-08 DIAGNOSIS — R600 Localized edema: Secondary | ICD-10-CM | POA: Diagnosis not present

## 2022-01-08 DIAGNOSIS — R0789 Other chest pain: Secondary | ICD-10-CM | POA: Diagnosis not present

## 2022-02-05 DIAGNOSIS — R609 Edema, unspecified: Secondary | ICD-10-CM | POA: Diagnosis not present

## 2022-02-05 DIAGNOSIS — I10 Essential (primary) hypertension: Secondary | ICD-10-CM | POA: Diagnosis not present

## 2022-02-07 DIAGNOSIS — E8770 Fluid overload, unspecified: Secondary | ICD-10-CM | POA: Diagnosis not present

## 2022-03-13 DIAGNOSIS — N529 Male erectile dysfunction, unspecified: Secondary | ICD-10-CM | POA: Diagnosis not present

## 2022-03-13 DIAGNOSIS — I10 Essential (primary) hypertension: Secondary | ICD-10-CM | POA: Diagnosis not present

## 2022-04-22 DIAGNOSIS — Z0000 Encounter for general adult medical examination without abnormal findings: Secondary | ICD-10-CM | POA: Diagnosis not present

## 2022-04-22 DIAGNOSIS — I10 Essential (primary) hypertension: Secondary | ICD-10-CM | POA: Diagnosis not present

## 2022-04-22 DIAGNOSIS — Z1389 Encounter for screening for other disorder: Secondary | ICD-10-CM | POA: Diagnosis not present

## 2022-04-22 DIAGNOSIS — E7800 Pure hypercholesterolemia, unspecified: Secondary | ICD-10-CM | POA: Diagnosis not present

## 2022-06-11 DIAGNOSIS — R200 Anesthesia of skin: Secondary | ICD-10-CM | POA: Diagnosis not present

## 2022-06-17 DIAGNOSIS — G5622 Lesion of ulnar nerve, left upper limb: Secondary | ICD-10-CM | POA: Diagnosis not present

## 2022-07-01 ENCOUNTER — Other Ambulatory Visit: Payer: Self-pay

## 2022-07-01 DIAGNOSIS — R202 Paresthesia of skin: Secondary | ICD-10-CM

## 2022-07-03 ENCOUNTER — Ambulatory Visit: Payer: Medicare PPO | Admitting: Neurology

## 2022-07-03 DIAGNOSIS — R202 Paresthesia of skin: Secondary | ICD-10-CM | POA: Diagnosis not present

## 2022-07-03 DIAGNOSIS — G5622 Lesion of ulnar nerve, left upper limb: Secondary | ICD-10-CM

## 2022-07-03 NOTE — Procedures (Signed)
  [facility]  [address] [place], [address]  [place], [place] [address] Tel: [phone] Fax: [phone] Test Date:  07/03/2022  Patient: Trevor Rangel DOB: 1950/04/09 Physician: Nita Sickle, DO  Sex: Male Height: 6\' 3"  Ref Phys: Ramond Marrow, MD  ID#: [id number]   Technician:    History: This is a 72 year old man with history of left ulnar nerve decompression referred for evaluation of left hand paresthesias and weakness.  NCV & EMG Findings: Extensive electrode diagnostic testing of the left upper extremity shows:  Left ulnar sensory response shows prolonged latency (3.6 ms) and reduced amplitude (4.9 V).  Left median sensory responses within normal limits. Left ulnar motor response shows prolonged latency (3.4 ms), reduced amplitude (3.4 mV), and decreased conduction velocity (A Elbow-B Elbow, 36 m/s).  Left median motor responses within normal limits.   Chronic motor axon loss changes are seen affecting the ulnar innervated muscles, as well as the CT 5-6 myotome.  There is no evidence of accompanying active denervation.    Impression: Left ulnar neuropathy with slowing across the elbow, with demyelinating and axonal features.  Overall, these findings are severe in degree electrically. 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   ___________________________ Nita Sickle, DO    Nerve Conduction Studies   Stim Site NR Peak (ms) Norm Peak (ms) O-P Amp (V) Norm O-P Amp  Left Median Anti Sensory (2nd Digit)  32 C  Wrist    3.8 <3.8 14.8 >10  Left Ulnar Anti Sensory (5th Digit)  32 C  Wrist    *3.6 <3.2 *4.9 >5     Stim Site NR Onset (ms) Norm Onset (ms) O-P Amp (mV) Norm O-P Amp Site1 Site2 Delta-0 (ms) Dist (cm) Vel (m/s) Norm Vel (m/s)  Left Median Motor (Abd Poll Brev)  32 C  Wrist    3.3 <4.0 10.8 >5 Elbow Wrist 6.5 35.0 54 >50  Elbow    9.8  9.5         Left Ulnar Motor (Abd Dig Minimi)  32 C  Wrist    *3.4 <3.1 *3.4 >7 B Elbow  Wrist 4.6 27.0 59 >50  B Elbow    8.0  2.7  A Elbow B Elbow 2.8 10.0 *36 >50  A Elbow    10.8  2.7         Left Ulnar (FDI) Motor (1st DI)  32 C  Wrist    3.5 <4.5 *4.1 >7 B Elbow Wrist 5.8 35.0 60 >50  B Elbow    9.3  3.6  A Elbow B Elbow 3.0 10.0 *33 >50  A Elbow    12.3  3.0          Electromyography   Side Muscle Ins.Act Fibs Fasc Recrt Amp Dur Poly Activation Comment  Left 1stDorInt Nml Nml Nml *3- *1+ *1+ *1+ Nml N/A  Left Abd Poll Brev Nml Nml Nml Nml Nml Nml Nml Nml N/A  Left PronatorTeres Nml Nml Nml Nml Nml Nml Nml Nml N/A  Left Biceps Nml Nml Nml *2- *1+ *1+ *1+ Nml N/A  Left Triceps Nml Nml Nml Nml Nml Nml Nml Nml N/A  Left Deltoid Nml Nml Nml *2- *1+ *1+ *1+ Nml N/A  Left Abd Dig Min Nml Nml Nml *3- *1+ *1+ *1+ Nml N/A  Left FlexCarpiUln Nml Nml Nml *2- *1+ *1+ *1+ Nml N/A      Waveforms:

## 2022-07-22 DIAGNOSIS — G5622 Lesion of ulnar nerve, left upper limb: Secondary | ICD-10-CM | POA: Diagnosis not present

## 2022-09-05 DIAGNOSIS — Z961 Presence of intraocular lens: Secondary | ICD-10-CM | POA: Diagnosis not present

## 2022-09-05 DIAGNOSIS — H401132 Primary open-angle glaucoma, bilateral, moderate stage: Secondary | ICD-10-CM | POA: Diagnosis not present

## 2022-09-05 DIAGNOSIS — I10 Essential (primary) hypertension: Secondary | ICD-10-CM | POA: Diagnosis not present

## 2022-10-21 ENCOUNTER — Other Ambulatory Visit: Payer: Self-pay | Admitting: Internal Medicine

## 2022-10-21 DIAGNOSIS — I10 Essential (primary) hypertension: Secondary | ICD-10-CM | POA: Diagnosis not present

## 2022-10-21 DIAGNOSIS — G629 Polyneuropathy, unspecified: Secondary | ICD-10-CM | POA: Diagnosis not present

## 2022-10-21 DIAGNOSIS — E7800 Pure hypercholesterolemia, unspecified: Secondary | ICD-10-CM | POA: Diagnosis not present

## 2022-10-21 DIAGNOSIS — Z23 Encounter for immunization: Secondary | ICD-10-CM | POA: Diagnosis not present

## 2022-10-21 DIAGNOSIS — I77810 Thoracic aortic ectasia: Secondary | ICD-10-CM | POA: Diagnosis not present

## 2022-10-21 DIAGNOSIS — J301 Allergic rhinitis due to pollen: Secondary | ICD-10-CM | POA: Diagnosis not present

## 2022-10-21 DIAGNOSIS — G8929 Other chronic pain: Secondary | ICD-10-CM | POA: Diagnosis not present

## 2022-11-12 ENCOUNTER — Ambulatory Visit
Admission: RE | Admit: 2022-11-12 | Discharge: 2022-11-12 | Disposition: A | Discharge status: Home / Self Care | Payer: 59 | Source: Ambulatory Visit | Attending: Internal Medicine | Admitting: Internal Medicine

## 2022-11-12 DIAGNOSIS — J439 Emphysema, unspecified: Secondary | ICD-10-CM | POA: Diagnosis not present

## 2022-11-12 DIAGNOSIS — J984 Other disorders of lung: Secondary | ICD-10-CM | POA: Diagnosis not present

## 2022-11-12 DIAGNOSIS — I77810 Thoracic aortic ectasia: Secondary | ICD-10-CM

## 2022-11-12 DIAGNOSIS — I7121 Aneurysm of the ascending aorta, without rupture: Secondary | ICD-10-CM | POA: Diagnosis not present

## 2022-11-12 DIAGNOSIS — I517 Cardiomegaly: Secondary | ICD-10-CM | POA: Diagnosis not present

## 2022-11-12 MED ORDER — IOPAMIDOL (ISOVUE-370) INJECTION 76%
200.0000 mL | Freq: Once | INTRAVENOUS | Status: AC | PRN
  Administered 2022-11-12: 75 mL via INTRAVENOUS

## 2022-11-13 ENCOUNTER — Inpatient Hospital Stay: Admission: RE | Admit: 2022-11-13 | Payer: Medicare PPO | Source: Ambulatory Visit

## 2023-01-30 DIAGNOSIS — H401132 Primary open-angle glaucoma, bilateral, moderate stage: Secondary | ICD-10-CM | POA: Diagnosis not present

## 2023-01-30 DIAGNOSIS — H04123 Dry eye syndrome of bilateral lacrimal glands: Secondary | ICD-10-CM | POA: Diagnosis not present

## 2023-01-30 DIAGNOSIS — Z961 Presence of intraocular lens: Secondary | ICD-10-CM | POA: Diagnosis not present

## 2023-01-30 DIAGNOSIS — I10 Essential (primary) hypertension: Secondary | ICD-10-CM | POA: Diagnosis not present

## 2023-04-24 DIAGNOSIS — Z0000 Encounter for general adult medical examination without abnormal findings: Secondary | ICD-10-CM | POA: Diagnosis not present

## 2023-04-24 DIAGNOSIS — N529 Male erectile dysfunction, unspecified: Secondary | ICD-10-CM | POA: Diagnosis not present

## 2023-04-24 DIAGNOSIS — E7800 Pure hypercholesterolemia, unspecified: Secondary | ICD-10-CM | POA: Diagnosis not present

## 2023-04-24 DIAGNOSIS — K219 Gastro-esophageal reflux disease without esophagitis: Secondary | ICD-10-CM | POA: Diagnosis not present

## 2023-04-24 DIAGNOSIS — J439 Emphysema, unspecified: Secondary | ICD-10-CM | POA: Diagnosis not present

## 2023-04-24 DIAGNOSIS — Z1331 Encounter for screening for depression: Secondary | ICD-10-CM | POA: Diagnosis not present

## 2023-04-24 DIAGNOSIS — I77819 Aortic ectasia, unspecified site: Secondary | ICD-10-CM | POA: Diagnosis not present

## 2023-04-24 DIAGNOSIS — I10 Essential (primary) hypertension: Secondary | ICD-10-CM | POA: Diagnosis not present

## 2023-06-27 DIAGNOSIS — E7800 Pure hypercholesterolemia, unspecified: Secondary | ICD-10-CM | POA: Diagnosis not present

## 2023-06-27 DIAGNOSIS — J439 Emphysema, unspecified: Secondary | ICD-10-CM | POA: Diagnosis not present

## 2023-06-27 DIAGNOSIS — I10 Essential (primary) hypertension: Secondary | ICD-10-CM | POA: Diagnosis not present

## 2023-07-23 DIAGNOSIS — R936 Abnormal findings on diagnostic imaging of limbs: Secondary | ICD-10-CM | POA: Diagnosis not present

## 2023-07-23 DIAGNOSIS — M79641 Pain in right hand: Secondary | ICD-10-CM | POA: Diagnosis not present

## 2023-07-23 DIAGNOSIS — M19041 Primary osteoarthritis, right hand: Secondary | ICD-10-CM | POA: Diagnosis not present

## 2023-07-23 DIAGNOSIS — M19032 Primary osteoarthritis, left wrist: Secondary | ICD-10-CM | POA: Diagnosis not present

## 2023-07-23 DIAGNOSIS — M79642 Pain in left hand: Secondary | ICD-10-CM | POA: Diagnosis not present

## 2023-07-27 DIAGNOSIS — J439 Emphysema, unspecified: Secondary | ICD-10-CM | POA: Diagnosis not present

## 2023-07-27 DIAGNOSIS — E7800 Pure hypercholesterolemia, unspecified: Secondary | ICD-10-CM | POA: Diagnosis not present

## 2023-07-27 DIAGNOSIS — I10 Essential (primary) hypertension: Secondary | ICD-10-CM | POA: Diagnosis not present

## 2023-08-05 DIAGNOSIS — M79645 Pain in left finger(s): Secondary | ICD-10-CM | POA: Diagnosis not present

## 2023-08-05 DIAGNOSIS — M65331 Trigger finger, right middle finger: Secondary | ICD-10-CM | POA: Diagnosis not present

## 2023-08-05 NOTE — H&P (Signed)
 PREOPERATIVE H&P  Chief Complaint: TRIGGER FINGER RIGHT LONG FINGER  HPI: Trevor Rangel is a 73 y.o. male who presents with a diagnosis of TRIGGER FINGER RIGHT LONG FINGER. Symptoms are rated as moderate to severe, and have been worsening.  This is significantly impairing activities of daily living.  He has elected for surgical management.   Past Medical History:  Diagnosis Date   Allergy    BPH (benign prostatic hyperplasia)    Cataract    surgery   DDD (degenerative disc disease), lumbar    GERD (gastroesophageal reflux disease)    Hyperlipidemia    Hypertension    Sciatica    Past Surgical History:  Procedure Laterality Date   COLONOSCOPY  ~2004   Normal per patient   ELBOW SURGERY Left 2019   INGUINAL HERNIA REPAIR Left 05/04/2009   Dr Lorriane   INGUINAL HERNIA REPAIR Bilateral 03/17/2013   Procedure: LAPAROSCOPIC EXPLORATION AND REPAIR OF BILATERAL INGUINAL HERNIAS;  Surgeon: Elspeth KYM Schultze, MD;  Location: [facility];  Service: General;  Laterality: Bilateral;   INSERTION OF MESH Bilateral 03/17/2013   Procedure: INSERTION OF MESH;  Surgeon: Elspeth KYM Schultze, MD;  Location: [facility];  Service: General;  Laterality: Bilateral;   KNEE SURGERY     Social History   Socioeconomic History   Marital status: Single    Spouse name: Not on file   Number of children: Not on file   Years of education: Not on file   Highest education level: Not on file  Occupational History   Not on file  Tobacco Use   Smoking status: Former    Current packs/day: 0.00    Types: Cigarettes    Quit date: 03/10/2010    Years since quitting: 13.4   Smokeless tobacco: Never  Vaping Use   Vaping status: Never Used  Substance and Sexual Activity   Alcohol use: Yes    Alcohol/week: 12.0 standard drinks of alcohol    Types: 12 Cans of beer per week   Drug use: No   Sexual activity: Yes  Other Topics Concern   Not on file  Social History Narrative   Right handed    Lives with girl friend     Social Drivers of Corporate investment banker Strain: Not on file  Food Insecurity: Not on file  Transportation Needs: Not on file  Physical Activity: Not on file  Stress: Not on file  Social Connections: Not on file   Family History  Problem Relation Age of Onset   Heart disease Mother    Cancer Father        prostate   Colon cancer Neg Hx    Esophageal cancer Neg Hx    Rectal cancer Neg Hx    Stomach cancer Neg Hx    No Known Allergies Prior to Admission medications   Medication Sig Start Date End Date Taking? Authorizing Provider  amLODipine (NORVASC) 10 MG tablet Take 10 mg by mouth daily. 05/10/13   [provider]  ASPIRIN 81 PO     [provider]  cetirizine (ZYRTEC) 10 MG tablet Take 10 mg by mouth daily. 09/24/20   [provider]  cloNIDine (CATAPRES) 0.1 MG tablet Take 0.1 mg by mouth daily.    [provider]  dorzolamide-timolol (COSOPT) 22.3-6.8 MG/ML ophthalmic solution SMARTSIG:In Eye(s) 01/02/20   [provider]  famotidine (PEPCID) 20 MG tablet Take 20 mg by mouth 2 (two) times daily.    [provider]  furosemide (LASIX) 20 MG tablet Take 20 mg by mouth daily.  05/25/13   [provider]  lisinopril-hydrochlorothiazide (ZESTORETIC) 20-12.5 MG tablet Take 1 tablet by mouth daily. 10/24/20   [provider]  meloxicam (MOBIC) 15 MG tablet  11/11/19   [provider]  Menthol-Methyl Salicylate (MUSCLE RUB) 10-15 % CREA Apply 1 application topically 3 (three) times daily as needed for muscle pain.    [provider]  methylcellulose (ARTIFICIAL TEARS) 1 % ophthalmic solution Place 1 drop into both eyes 3 (three) times daily as needed (dry eyes).    [provider]  ranitidine (ZANTAC) 150 MG tablet Take 150 mg by mouth daily. Patient not taking: Reported on 03/28/2021    [provider]  simvastatin (ZOCOR) 20 MG tablet Take 20 mg by mouth at bedtime.  05/25/13    [provider]     Positive ROS: All other systems have been reviewed and were otherwise negative with the exception of those mentioned in the HPI and as above.  Physical Exam: General: Alert, no acute distress Cardiovascular: No pedal edema Respiratory: No cyanosis, no use of accessory musculature GI: No organomegaly, abdomen is soft and non-tender Skin: No lesions in the area of chief complaint Neurologic: Sensation intact distally Psychiatric: Patient is competent for consent with normal mood and affect Lymphatic: No axillary or cervical lymphadenopathy  MUSCULOSKELETAL: TTP right long finger, nodule at A1 pulley in palmar surface, limited ROM long finger, triggering seen, no edema or erythema, NVI   Imaging: n/a   Assessment: TRIGGER FINGER RIGHT LONG FINGER  Plan: Plan for Procedure(s): RELEASE, A1 PULLEY, FOR TRIGGER FINGER  The risks benefits and alternatives were discussed with the patient including but not limited to the risks of nonoperative treatment, versus surgical intervention including infection, bleeding, nerve injury,  blood clots, cardiopulmonary complications, morbidity, mortality, among others, and they were willing to proceed.   Weightbearing: WBAT RUE Orthopedic devices: n/a Showering: POD 3 Dressing: reinforce PRN Medicines: Ultram, Tylenol , Zofran   Discharge: home Follow up: 08/28/23 at 10:45am    Gerard CHRISTELLA Large, PA-C Office [phone] 08/05/2023 5:39 PM

## 2023-08-13 ENCOUNTER — Other Ambulatory Visit: Payer: Self-pay

## 2023-08-13 ENCOUNTER — Encounter (HOSPITAL_BASED_OUTPATIENT_CLINIC_OR_DEPARTMENT_OTHER): Payer: Self-pay | Admitting: Orthopedic Surgery

## 2023-08-14 ENCOUNTER — Encounter (HOSPITAL_BASED_OUTPATIENT_CLINIC_OR_DEPARTMENT_OTHER)
Admission: RE | Admit: 2023-08-14 | Discharge: 2023-08-14 | Disposition: A | Discharge status: Home / Self Care | Source: Ambulatory Visit | Attending: Orthopedic Surgery | Admitting: Orthopedic Surgery

## 2023-08-14 DIAGNOSIS — Z01818 Encounter for other preprocedural examination: Secondary | ICD-10-CM | POA: Diagnosis not present

## 2023-08-14 DIAGNOSIS — I10 Essential (primary) hypertension: Secondary | ICD-10-CM | POA: Diagnosis not present

## 2023-08-14 LAB — BASIC METABOLIC PANEL WITH GFR
Anion gap: 9 (ref 5–15)
BUN: 16 mg/dL (ref 8–23)
CO2: 25 mmol/L (ref 22–32)
Calcium: 9.7 mg/dL (ref 8.9–10.3)
Chloride: 104 mmol/L (ref 98–111)
Creatinine, Ser: 0.87 mg/dL (ref 0.61–1.24)
GFR, Estimated: >60 mL/min (ref >60)
Glucose, Bld: 96 mg/dL (ref 70–99)
Potassium: 4.1 mmol/L (ref 3.5–5.1)
Sodium: 138 mmol/L (ref 135–145)

## 2023-08-14 NOTE — Progress Notes (Signed)

## 2023-08-14 NOTE — Progress Notes (Signed)
 EKG reviewed by Dr. Maryclare, pt will need cardiac clearance prior to surgery. Called Dr. Fransisco office and left message for Jackson.

## 2023-08-18 ENCOUNTER — Encounter (HOSPITAL_BASED_OUTPATIENT_CLINIC_OR_DEPARTMENT_OTHER): Admission: RE | Payer: Self-pay | Source: Home / Self Care

## 2023-08-18 ENCOUNTER — Ambulatory Visit (HOSPITAL_BASED_OUTPATIENT_CLINIC_OR_DEPARTMENT_OTHER): Admission: RE | Admit: 2023-08-18 | Payer: Self-pay | Source: Home / Self Care | Admitting: Orthopedic Surgery

## 2023-08-18 DIAGNOSIS — I10 Essential (primary) hypertension: Secondary | ICD-10-CM | POA: Diagnosis not present

## 2023-08-18 DIAGNOSIS — I499 Cardiac arrhythmia, unspecified: Secondary | ICD-10-CM | POA: Diagnosis not present

## 2023-08-18 DIAGNOSIS — I4891 Unspecified atrial fibrillation: Secondary | ICD-10-CM | POA: Diagnosis not present

## 2023-08-18 DIAGNOSIS — I77810 Thoracic aortic ectasia: Secondary | ICD-10-CM | POA: Diagnosis not present

## 2023-08-18 DIAGNOSIS — N63 Unspecified lump in unspecified breast: Secondary | ICD-10-CM | POA: Diagnosis not present

## 2023-08-18 SURGERY — RELEASE, A1 PULLEY, FOR TRIGGER FINGER
Anesthesia: Choice | Laterality: Right

## 2023-08-19 ENCOUNTER — Other Ambulatory Visit: Payer: Self-pay | Admitting: Internal Medicine

## 2023-08-19 ENCOUNTER — Other Ambulatory Visit (HOSPITAL_COMMUNITY): Payer: Self-pay | Admitting: Internal Medicine

## 2023-08-19 DIAGNOSIS — N63 Unspecified lump in unspecified breast: Secondary | ICD-10-CM

## 2023-08-19 DIAGNOSIS — I4891 Unspecified atrial fibrillation: Secondary | ICD-10-CM

## 2023-08-21 ENCOUNTER — Other Ambulatory Visit: Payer: Self-pay | Admitting: Internal Medicine

## 2023-08-21 DIAGNOSIS — N63 Unspecified lump in unspecified breast: Secondary | ICD-10-CM

## 2023-08-26 ENCOUNTER — Encounter (HOSPITAL_COMMUNITY): Payer: Self-pay | Admitting: Internal Medicine

## 2023-09-01 DIAGNOSIS — I10 Essential (primary) hypertension: Secondary | ICD-10-CM | POA: Diagnosis not present

## 2023-09-01 DIAGNOSIS — I4891 Unspecified atrial fibrillation: Secondary | ICD-10-CM | POA: Diagnosis not present

## 2023-09-03 DIAGNOSIS — I4891 Unspecified atrial fibrillation: Secondary | ICD-10-CM | POA: Diagnosis not present

## 2023-09-07 ENCOUNTER — Telehealth: Payer: Self-pay

## 2023-09-07 NOTE — Telephone Encounter (Signed)
   Name: Trevor Rangel  DOB: 1950-04-20  MRN: [id number]  Primary Cardiologist: None  Chart reviewed as part of pre-operative protocol coverage. Because of Devonne L Plunk's past medical history and time since last visit, he will require a follow-up in-office visit in order to better assess preoperative cardiovascular risk.  Pre-op covering staff: - Please schedule appointment and call patient to inform them. If patient already had an upcoming appointment within acceptable timeframe, please add pre-op clearance to the appointment notes so provider is aware. - Please contact requesting surgeon's office via preferred method (i.e, phone, fax) to inform them of need for appointment prior to surgery.  Colsen Modi D Abbigail Anstey, NP  09/07/2023, 4:29 PM

## 2023-09-07 NOTE — Telephone Encounter (Signed)
 Spoke to patient and he informed me that he was told his surgery was canceled because he needed to have cardiac clearance. I told him we would call Trevor Rangel and see what happened and we would call him back soon.

## 2023-09-07 NOTE — Telephone Encounter (Signed)
 Spoke with [facility] office and I was informed that they would send over the information for the preop clearance request.

## 2023-09-07 NOTE — Telephone Encounter (Signed)
   Pre-operative Risk Assessment    Patient Name: Trevor Rangel  DOB: 12-Jan-1951 MRN: [id number]   Date of last office visit: NONE Date of next office visit: NONE   Request for Surgical Clearance    Procedure:  RIGHT LONG FINGER TRIGGER RELEASE  Date of Surgery:  Clearance TBD                                Surgeon:  EVALENE CHANCY, MD Surgeon's Group or Practice Name:  [facility] Phone number:  [phone]  [phone] Fax number:  [phone]  ATTN: KELLY HIGH   Type of Clearance Requested:   - Medical  - Pharmacy:  Hold Aspirin     Type of Anesthesia:  CHOICE   Additional requests/questions:    [name]   09/07/2023, 3:08 PM

## 2023-09-07 NOTE — Telephone Encounter (Signed)
 There is referral for the patient to be seen as a new patient for preop clearance. Please advise.

## 2023-09-08 NOTE — Telephone Encounter (Signed)
 Preop clearance now scheduled Monday following the patient's scheduled echocardiogram

## 2023-09-27 DIAGNOSIS — I10 Essential (primary) hypertension: Secondary | ICD-10-CM | POA: Diagnosis not present

## 2023-09-27 DIAGNOSIS — E7800 Pure hypercholesterolemia, unspecified: Secondary | ICD-10-CM | POA: Diagnosis not present

## 2023-09-27 DIAGNOSIS — J439 Emphysema, unspecified: Secondary | ICD-10-CM | POA: Diagnosis not present

## 2023-10-02 ENCOUNTER — Ambulatory Visit (HOSPITAL_COMMUNITY)
Admission: RE | Admit: 2023-10-02 | Discharge: 2023-10-02 | Disposition: A | Discharge status: Home / Self Care | Source: Ambulatory Visit | Attending: Internal Medicine | Admitting: Internal Medicine

## 2023-10-02 ENCOUNTER — Encounter (HOSPITAL_COMMUNITY): Payer: Self-pay | Admitting: Radiology

## 2023-10-02 DIAGNOSIS — I4891 Unspecified atrial fibrillation: Secondary | ICD-10-CM | POA: Diagnosis not present

## 2023-10-02 LAB — ECHOCARDIOGRAM COMPLETE
Est EF: 55
MV M vel: 5.13 m/s
MV Peak grad: 105.3 mmHg
S' Lateral: 3 cm

## 2023-10-02 NOTE — Telephone Encounter (Signed)
 RMA Delon Medley contacted my thru secure chat, to please review the pt who is on 10/05/23 Glendia Ferrier, [facility] incorrectly. Pt was scheduled incorrectly by CMA Amieya C. I assured RMA Delon that I will call the pt and correct the appt.   Pt has been cancelled from 10/05/23 Glendia Ferrier, [facility] and scheduled correctly on HF1st with Dayna Dunn, [facility] 10/15/23. Pt agrees to new appt date 10/15/23. Pt is aware of [address] address.

## 2023-10-02 NOTE — Progress Notes (Signed)
 Patient dizzy, lightheadedness and off balance when walking. Patient stated he does not lay flat when sleeping. Symptoms resolved with patient sitting. Instructed patient to go to ER or urgent care if symptoms continued. Patient walked down hall with minimal assistance to leave department. Patient was discharged in stable condition.

## 2023-10-05 ENCOUNTER — Ambulatory Visit: Admitting: Physician Assistant

## 2023-10-06 DIAGNOSIS — I77819 Aortic ectasia, unspecified site: Secondary | ICD-10-CM | POA: Diagnosis not present

## 2023-10-06 DIAGNOSIS — I517 Cardiomegaly: Secondary | ICD-10-CM | POA: Diagnosis not present

## 2023-10-06 DIAGNOSIS — I4891 Unspecified atrial fibrillation: Secondary | ICD-10-CM | POA: Diagnosis not present

## 2023-10-14 NOTE — Progress Notes (Addendum)
 Cardiology Office Note    Date:  10/15/2023  ID:  Trevor Rangel 01/10/1951, MRN [id number] PCP:  Rexanne Ingle, MD  Cardiologist:  None - New  Chief Complaint: atrial fibrillation  History of Present Illness: Trevor Rangel is a 73 y.o. male with visit-pertinent history of BPH, cataracts, HTN, HLD, GERD, ascending aortic aneurysm (4.2cm by CT 10/2022), former tobacco use, neuropathy seen for evaluation of preoperative evaluation and atrial fibrillation at the request of Dr. Beverley.  The patient has no formal cardiac hx though had prior CT 10/2022 showing 4.2cm ascending TAA and cardiomegaly. He recently presented to ortho for trigger finger surgery and found to be in rate controlled atrial fibrillation. Surgery was cancelled and preoperative evaluation requested. He saw his PCP and Eliquis was started. ASA and meloxicam were discontinued. Prescribing date was 09/01/23 per discussion with pharmacy. Echo 10/02/23 showed EF 55% but inferior basal hypokinesis, moderate asymmetric LVH of basal/septal segments, mildly reduced RV function, mildly elevated PASP, moderate BAE, mild MR, moderate TR, moderate aortic dilation of root/ascending aorta, dilated IVC.   He is seen for follow-up today overall doing well. He does not have any awareness of his atrial fibrillation, remains in AF with HR  70s. Denies any chest pain. He has noticed mild gradual DOE with higher levels of activities over the last 1 years' time but able to participate in ADLs without noticeable issue. No worsening edema, no orthopnea or PND. No bleeding. He has frequent difficulty remembering to take Eliquis twice a day, often only taking it once per day. He is eager to proceed with his trigger finger surgery as soon as possible. He does snore and has previously been told he stops breathing during sleep. No prior OSA eval.  Family History: mother had CHF Tobacco: former, quit 2011 Alcohol: none Drug use: none  Labwork independently  reviewed: 08/19/2023 Cr 0.82, K 4.0, Hgb 13.7, plt 235, TSH OK 09/2022 LFTS ok, LDL 131, trig 103  ROS: .    Please see the history of present illness.  All other systems are reviewed and otherwise negative.  Studies Reviewed: Trevor    EKG:  EKG is ordered today, personally reviewed, demonstrating:  EKG Interpretation Date/Time:  Thursday October 15 2023 10:42:03 EDT Ventricular Rate:  72 PR Interval:    QRS Duration:  92 QT Interval:  404 QTC Calculation: 442 R Axis:   50  Text Interpretation: Atrial fibrillation When compared with ECG of 14-Aug-2023 10:19, No significant change was found Nonspecific STTW changes with minimal early ST upsloping II, avF, V5-V6 Confirmed by Loic Hobin [phone]) on 10/15/2023 11:02:20 AM    CV Studies: Cardiac studies reviewed are outlined and summarized above. Otherwise please see EMR for full report.   Current Reported Medications:.    Current Meds  Medication Sig   amLODipine (NORVASC) 10 MG tablet Take 10 mg by mouth daily.   b complex vitamins capsule Take 1 capsule by mouth daily.   cetirizine (ZYRTEC) 10 MG tablet Take 10 mg by mouth daily.   cloNIDine (CATAPRES) 0.1 MG tablet Take 0.1 mg by mouth daily.   dorzolamide-timolol (COSOPT) 22.3-6.8 MG/ML ophthalmic solution SMARTSIG:In Eye(s)   ELIQUIS 5 MG TABS tablet Take 5 mg by mouth 2 (two) times daily.   famotidine (PEPCID) 20 MG tablet Take 20 mg by mouth 2 (two) times daily.   furosemide (LASIX) 20 MG tablet Take 20 mg by mouth daily.    lisinopril-hydrochlorothiazide (ZESTORETIC) 20-12.5 MG  tablet Take 1 tablet by mouth daily.   Menthol-Methyl Salicylate (MUSCLE RUB) 10-15 % CREA Apply 1 application topically 3 (three) times daily as needed for muscle pain.   methylcellulose (ARTIFICIAL TEARS) 1 % ophthalmic solution Place 1 drop into both eyes 3 (three) times daily as needed (dry eyes).   pregabalin (LYRICA) 75 MG capsule Take 75 mg by mouth 2 (two) times daily.   simvastatin (ZOCOR)  20 MG tablet Take 20 mg by mouth at bedtime.     Physical Exam:    VS:  BP (!) 132/92   Pulse 72   Ht 6' 2 (1.88 m)   Wt 236 lb 9.6 oz (107.3 kg)   SpO2 96%   BMI 30.38 kg/m    Wt Readings from Last 3 Encounters:  10/15/23 236 lb 9.6 oz (107.3 kg)  03/28/21 218 lb 3.2 oz (99 kg)  09/07/20 215 lb (97.5 kg)    GEN: Well nourished, well developed in no acute distress NECK: No JVD; No carotid bruits CARDIAC: RRR, no murmurs, rubs, gallops RESPIRATORY:  Clear to auscultation without rales, wheezing or rhonchi  ABDOMEN: Soft, non-tender, non-distended EXTREMITIES:  No edema; No acute deformity   Asessement and Plan:.    1. Newly diagnosed atrial fibrillation, duration not known, superimposed on need for pre-operative evaluation, mild DOE, abnormal echocardiogram with possible inferior wall motion abnormality, mild RV dysfunction, mild pulmonary HTN, and moderate asymmetric LVH - discussed case with Dr. Wonda. Patient remains in persistent atrial fibrillation, rate controlled without any specific AVN blockers (except clonidine which is not traditionally strong at HR control). He is very eager to pursue getting his trigger finger surgery done as he feels the discomfort is affecting quality of life. He has intermittently been missing doses of Eliquis as he feels it is challenging to remember this twice a day, so if we were to pursue DCCV prior to surgery, that would set him back at least another 7 weeks (due to need for uninterrupted anticoagulation 3 weeks pre-/4 weeks post-cardioversion). He has mild DOE with higher levels of activity but is able to complete over 4 METS without angina. Per discussion with Dr. Wonda, given stable heart rate, normal LV function, and generally low risk category of orthopedic surgery, we will allow him to proceed with his finger surgery before broaching the idea of potential trial of cardioversion in the near future. Due to difficulty remembering BID blood thinner,  per shared decision making with patient, will switch Eliquis to Xarelto  20mg  daily q-supper. Will recheck H/H and BMET today to ensure stable. Per discussion with pharmacist, may hold Xarelto  2 days prior to surgery. Would just ask that they restart anticoagulation as soon as felt stable from surgical standpoint.  I will send a copy of this note to his surgical team to make them aware. Please note we are also ordering sleep study for suspected sleep apnea as well, need to take that into consideration when monitoring patient during surgery.   The patient is noted to have evidence of LVH as well as ? Inferior wall motion abnormality on echocardiogram. Given his history of neuropathy and rate controlled atrial fibrillation, there is also question of whether he could have underlying infiltrative cardiomyopathy. Per discussion with Dr. Wonda, will pursue cardiac MRI to further evaluate this. If he truly does have a wall motion abnormality, we can consider stress testing based on the findings. These studies do not need to be completed prior to trigger finger surgery. We will also look  for the MRI to see if we can follow up the ascending TAA size.  Addendum 10/23/23: per phone note 10/22/23, surgeon office sent duplicate request asking for clearance. This note was faxed on 10/15/23. Will fax again today, and will ask our callback team to ensure surgeon confirms they received it. Please note, as above, patient is on longer on aspirin as he is now on anticoagulation with Xarelto . Further testing is planned, but patient may proceed with trigger finger surgery as requested with plan outlined above.  2. Mild MR, moderate TR - follow clinically for now in context above.  4. Ascending aortic aneurysm - due for re-eval 10/2022, will see if dimension is outlined on cMRI.  5. Sleep disordered breathing - STOPBANG 6, high risk for underlying OSA. Not tech savvy, so will defer Itamar and plan in lab sleep study.  6. HTN -  initial BP 132/92, recheck 118/74. BP has been managed by PCP, no acute changes to regimen made today. Note he appears to be on both loop diuretic and thiazide which is a less traditional regimen. However, given clinical stability on this, will continue.    Disposition: F/u with me in 6 weeks.   Signed, Trevor Delehanty N Antoria Lanza, PA-C

## 2023-10-15 ENCOUNTER — Ambulatory Visit: Attending: Physician Assistant | Admitting: Physician Assistant

## 2023-10-15 ENCOUNTER — Encounter: Payer: Self-pay | Admitting: Physician Assistant

## 2023-10-15 VITALS — BP 118/74 | HR 72 | Ht 74.0 in | Wt 236.6 lb

## 2023-10-15 DIAGNOSIS — I7121 Aneurysm of the ascending aorta, without rupture: Secondary | ICD-10-CM

## 2023-10-15 DIAGNOSIS — R931 Abnormal findings on diagnostic imaging of heart and coronary circulation: Secondary | ICD-10-CM

## 2023-10-15 DIAGNOSIS — G4730 Sleep apnea, unspecified: Secondary | ICD-10-CM

## 2023-10-15 DIAGNOSIS — I480 Paroxysmal atrial fibrillation: Secondary | ICD-10-CM

## 2023-10-15 DIAGNOSIS — I517 Cardiomegaly: Secondary | ICD-10-CM

## 2023-10-15 DIAGNOSIS — Z01810 Encounter for preprocedural cardiovascular examination: Secondary | ICD-10-CM

## 2023-10-15 DIAGNOSIS — I340 Nonrheumatic mitral (valve) insufficiency: Secondary | ICD-10-CM

## 2023-10-15 DIAGNOSIS — I071 Rheumatic tricuspid insufficiency: Secondary | ICD-10-CM

## 2023-10-15 MED ORDER — RIVAROXABAN 20 MG PO TABS: 20.0000 mg | ORAL_TABLET | Freq: Every day | ORAL | 3 refills | Status: AC

## 2023-10-15 MED ORDER — RIVAROXABAN 15 MG PO TABS
15.0000 mg | ORAL_TABLET | Freq: Every day | ORAL | 3 refills | Status: DC
Start: 2023-10-15 — End: 2023-10-15

## 2023-10-15 NOTE — Patient Instructions (Addendum)
 Medication Instructions:  Your physician has recommended you make the following change in your medication:   STOP Eliquis  START Xarelto  20 mg taking daily with supper  *If you need a refill on your cardiac medications before your next appointment, please call your pharmacy*  Lab Work: TODAY:  BMET, & H&H  If you have labs (blood work) drawn today and your tests are completely normal, you will receive your results only by: MyChart Message (if you have MyChart) OR A paper copy in the mail If you have any lab test that is abnormal or we need to change your treatment, we will call you to review the results.  Testing/Procedures: Your physician has recommended that you have a sleep study. This test records several body functions during sleep, including: brain activity, eye movement, oxygen and carbon dioxide blood levels, heart rate and rhythm, breathing rate and rhythm, the flow of air through your mouth and nose, snoring, body muscle movements, and chest and belly movement.  Your physician has requested that you have a cardiac MRI. Cardiac MRI uses a computer to create images of your heart as its beating, producing both still and moving pictures of your heart and major blood vessels. For further information please visit [email or web address]. Please follow the instruction sheet given to you BELOW:    You are scheduled for Cardiac MRI at the location below.    [facility] [address] [place], [place] [address] Please take advantage of the free valet parking available at the [facility] and [employer] (Entrance C).  Proceed to the [facility] Radiology Department (First Floor) for check-in.   OR   [facility] [address] [place], [place] [address] Please go to the [facility] and check-in with the desk attendant.   Magnetic resonance imaging (MRI) is a painless test that produces images of the inside of the body without using  Xrays.  During an MRI, strong magnets and radio waves work together in a Data processing manager to form detailed images.   MRI images may provide more details about a medical condition than X-rays, CT scans, and ultrasounds can provide.  You may be given earphones to listen for instructions.  You may eat a light breakfast and take medications as ordered with the exception of furosemide, hydrochlorothiazide, chlorthalidone or spironolactone (or any other fluid pill). If you are undergoing a stress MRI, please avoid stimulants for 12 hr prior to test. (I.e. Caffeine, nicotine, chocolate, or antihistamine medications)  If your provider has ordered anti-anxiety medications for this test, then you will need a driver.  An IV will be inserted into one of your veins. Contrast material will be injected into your IV. It will leave your body through your urine within a day. You may be told to drink plenty of fluids to help flush the contrast material out of your system.  You will be asked to remove all metal, including: Watch, jewelry, and other metal objects including hearing aids, hair pieces and dentures. Also wearable glucose monitoring systems (ie. Freestyle Libre and Omnipods) (Braces and fillings normally are not a problem.)   TEST WILL TAKE APPROXIMATELY 1 HOUR  PLEASE NOTIFY SCHEDULING AT LEAST 24 HOURS IN ADVANCE IF YOU ARE UNABLE TO KEEP YOUR APPOINTMENT. [phone]  For more information and frequently asked questions, please visit our website : [email or web address]  Please call the Cardiac Imaging Nurse Navigators with any questions/concerns. [phone] Office    Follow-Up: At [facility], you and your  health needs are our priority.  As part of our continuing mission to provide you with exceptional heart care, our providers are all part of one team.  This team includes your primary Cardiologist (physician) and Advanced Practice Providers or APPs (Physician Assistants  and Nurse Practitioners) who all work together to provide you with the care you need, when you need it.  Your next appointment:   6 week(s)  Provider:   Dayna Dunn, PA-C          We recommend signing up for the patient portal called MyChart.  Sign up information is provided on this After Visit Summary.  MyChart is used to connect with patients for Virtual Visits (Telemedicine).  Patients are able to view lab/test results, encounter notes, upcoming appointments, etc.  Non-urgent messages can be sent to your provider as well.   To learn more about what you can do with MyChart, go to [email or web address].   Other Instructions

## 2023-10-16 DIAGNOSIS — I517 Cardiomegaly: Secondary | ICD-10-CM | POA: Diagnosis not present

## 2023-10-16 DIAGNOSIS — I340 Nonrheumatic mitral (valve) insufficiency: Secondary | ICD-10-CM | POA: Diagnosis not present

## 2023-10-16 DIAGNOSIS — I480 Paroxysmal atrial fibrillation: Secondary | ICD-10-CM | POA: Diagnosis not present

## 2023-10-16 DIAGNOSIS — Z01810 Encounter for preprocedural cardiovascular examination: Secondary | ICD-10-CM | POA: Diagnosis not present

## 2023-10-16 DIAGNOSIS — Z961 Presence of intraocular lens: Secondary | ICD-10-CM | POA: Diagnosis not present

## 2023-10-16 DIAGNOSIS — I071 Rheumatic tricuspid insufficiency: Secondary | ICD-10-CM | POA: Diagnosis not present

## 2023-10-16 DIAGNOSIS — I7121 Aneurysm of the ascending aorta, without rupture: Secondary | ICD-10-CM | POA: Diagnosis not present

## 2023-10-16 DIAGNOSIS — G4730 Sleep apnea, unspecified: Secondary | ICD-10-CM | POA: Diagnosis not present

## 2023-10-16 DIAGNOSIS — I10 Essential (primary) hypertension: Secondary | ICD-10-CM | POA: Diagnosis not present

## 2023-10-16 DIAGNOSIS — H04123 Dry eye syndrome of bilateral lacrimal glands: Secondary | ICD-10-CM | POA: Diagnosis not present

## 2023-10-16 DIAGNOSIS — H401132 Primary open-angle glaucoma, bilateral, moderate stage: Secondary | ICD-10-CM | POA: Diagnosis not present

## 2023-10-16 DIAGNOSIS — R931 Abnormal findings on diagnostic imaging of heart and coronary circulation: Secondary | ICD-10-CM | POA: Diagnosis not present

## 2023-10-16 LAB — BASIC METABOLIC PANEL WITH GFR
BUN/Creatinine Ratio: 11 (ref 10–24)
BUN: 9 mg/dL (ref 8–27)
CO2: 23 mmol/L (ref 20–29)
Calcium: 9.7 mg/dL (ref 8.6–10.2)
Chloride: 98 mmol/L (ref 96–106)
Creatinine, Ser: 0.83 mg/dL (ref 0.76–1.27)
Glucose: 97 mg/dL (ref 70–99)
Potassium: 3.8 mmol/L (ref 3.5–5.2)
Sodium: 137 mmol/L (ref 134–144)
eGFR: 93 mL/min/1.73 (ref >59)

## 2023-10-19 ENCOUNTER — Ambulatory Visit: Payer: Self-pay | Admitting: Physician Assistant

## 2023-10-19 NOTE — Progress Notes (Signed)
 Pt has been made aware of normal result and verbalized understanding.  jw

## 2023-10-22 ENCOUNTER — Other Ambulatory Visit: Payer: Self-pay

## 2023-10-22 DIAGNOSIS — I480 Paroxysmal atrial fibrillation: Secondary | ICD-10-CM | POA: Diagnosis not present

## 2023-10-22 NOTE — Telephone Encounter (Signed)
 Surgeon office sent duplicate request inquiring if pt has been cleared.   I did notice on the request that the CMA put in on 09/07/23 with the medications to be held. Only ASA was documented though surgeon need Xarelto  hold recommendations as well.   Also anesthesia was noted as choice; however the anesthesia on the form is LOCAL WITH MAC SEDATION   Procedure is 11/24/23, when originally came to our office as TBD back on 09/07/23.   I will forward to the preop APP for review. The pt was seen by Dayna Dunn, [facility] 10/15/23.

## 2023-10-22 NOTE — Progress Notes (Signed)
 Lab called because order wasn't showing. Order req number was not showing on original order, new order has been placed and released and is showing in [facility]'s system.

## 2023-10-23 ENCOUNTER — Ambulatory Visit: Payer: Self-pay | Admitting: Physician Assistant

## 2023-10-23 LAB — HEMOGLOBIN AND HEMATOCRIT, BLOOD
Hematocrit: 40.4 % (ref 37.5–51.0)
Hemoglobin: 13.3 g/dL (ref 13.0–17.7)

## 2023-10-23 NOTE — Telephone Encounter (Addendum)
 Hi callback, I saw this old phone note was sent back to preop APP. I already faxed a copy of my OV note on 9/18 outlining my preoperative recommendations. Pt no longer on ASA, now on Xarelto . I outlined recommendations and faxed note that day. I routed it again today as well. Can you confirm they received this note? Thanks! Otherwise removing from preop APP box.

## 2023-10-23 NOTE — Telephone Encounter (Signed)
 I will reach out to the surgery scheduler Burnard High to confirm if they received the notes for clearance from Dayna Dunn, PAC:  Abigail Raphael SAILOR, PA-C  Physician Assistant Certified Cardiology   Telephone Encounter Addendum   Creation Time: 10/23/2023 10:28 AM   Hi callback, I saw this old phone note was sent back to preop APP. I already faxed a copy of my OV note on 9/18 outlining my preoperative recommendations. Pt no longer on ASA, now on Xarelto . I outlined recommendations and faxed note that day. I routed it again today as well. Can you confirm they received this note? Thanks! Otherwise removing from preop APP box.     Per discussion with pharmacist, may hold Xarelto  2 days prior to surgery. Would just ask that they restart anticoagulation as soon as felt stable from surgical standpoint. I will send a copy of this note to his surgical team to make them aware.    I left a message for surgery scheduler that I will re-fax the notes to their office today.

## 2023-10-23 NOTE — Progress Notes (Signed)
 Pt has been made aware of normal result and verbalized understanding.  jw

## 2023-10-27 DIAGNOSIS — J439 Emphysema, unspecified: Secondary | ICD-10-CM | POA: Diagnosis not present

## 2023-10-27 DIAGNOSIS — L0390 Cellulitis, unspecified: Secondary | ICD-10-CM | POA: Diagnosis not present

## 2023-10-27 DIAGNOSIS — I10 Essential (primary) hypertension: Secondary | ICD-10-CM | POA: Diagnosis not present

## 2023-10-27 DIAGNOSIS — E7800 Pure hypercholesterolemia, unspecified: Secondary | ICD-10-CM | POA: Diagnosis not present

## 2023-11-04 ENCOUNTER — Other Ambulatory Visit: Payer: Self-pay | Admitting: Physician Assistant

## 2023-11-04 ENCOUNTER — Ambulatory Visit (HOSPITAL_COMMUNITY)
Admission: RE | Admit: 2023-11-04 | Discharge: 2023-11-04 | Disposition: A | Discharge status: Home / Self Care | Source: Ambulatory Visit | Attending: Physician Assistant | Admitting: Physician Assistant

## 2023-11-04 ENCOUNTER — Encounter (HOSPITAL_COMMUNITY): Payer: Self-pay

## 2023-11-04 DIAGNOSIS — I340 Nonrheumatic mitral (valve) insufficiency: Secondary | ICD-10-CM | POA: Diagnosis not present

## 2023-11-04 DIAGNOSIS — I480 Paroxysmal atrial fibrillation: Secondary | ICD-10-CM | POA: Diagnosis not present

## 2023-11-04 DIAGNOSIS — R931 Abnormal findings on diagnostic imaging of heart and coronary circulation: Secondary | ICD-10-CM

## 2023-11-04 DIAGNOSIS — I517 Cardiomegaly: Secondary | ICD-10-CM | POA: Diagnosis not present

## 2023-11-04 DIAGNOSIS — Z01810 Encounter for preprocedural cardiovascular examination: Secondary | ICD-10-CM

## 2023-11-04 DIAGNOSIS — I071 Rheumatic tricuspid insufficiency: Secondary | ICD-10-CM | POA: Insufficient documentation

## 2023-11-04 DIAGNOSIS — G4730 Sleep apnea, unspecified: Secondary | ICD-10-CM

## 2023-11-04 DIAGNOSIS — I7121 Aneurysm of the ascending aorta, without rupture: Secondary | ICD-10-CM | POA: Diagnosis not present

## 2023-11-04 MED ORDER — GADOBUTROL 1 MMOL/ML IV SOLN
10.0000 mL | Freq: Once | INTRAVENOUS | Status: AC | PRN
  Administered 2023-11-04: 10 mL via INTRAVENOUS

## 2023-11-09 ENCOUNTER — Ambulatory Visit: Payer: Self-pay | Admitting: Physician Assistant

## 2023-11-09 DIAGNOSIS — I872 Venous insufficiency (chronic) (peripheral): Secondary | ICD-10-CM | POA: Diagnosis not present

## 2023-11-13 NOTE — Addendum Note (Signed)
 Addended by: GEORGINA HILA on: 11/13/2023 11:08 AM   Modules accepted: Orders

## 2023-11-17 ENCOUNTER — Encounter (HOSPITAL_COMMUNITY): Payer: Self-pay | Admitting: Internal Medicine

## 2023-11-17 NOTE — Progress Notes (Signed)
 Patient's chart reviewed with Dr Patrisha, OK for [facility].

## 2023-11-18 ENCOUNTER — Other Ambulatory Visit: Payer: Self-pay

## 2023-11-18 ENCOUNTER — Encounter (HOSPITAL_BASED_OUTPATIENT_CLINIC_OR_DEPARTMENT_OTHER): Payer: Self-pay | Admitting: Orthopedic Surgery

## 2023-11-18 NOTE — Progress Notes (Signed)
   11/18/23 1524  PAT Phone Screen  Is the patient taking a GLP-1 receptor agonist? No  Do You Have Diabetes? No  Do You Have Hypertension? Yes  Have You Ever Been to the ER for Asthma? No  Have You Taken Oral Steroids in the Past 3 Months? No  Do you Take Phenteramine or any Other Diet Drugs? No  Recent  Lab Work, EKG, CXR? Yes  Where was this test performed? 10-15-23 EKG, a-fib rate 72  Do you have a history of heart problems? Yes  Cardiologist Name new onset a-fib, saw Dayna Dunn PA for cards 10-15-23  Have you ever had tests on your heart? Yes  What cardiac tests were performed? Echo  What date/year were cardiac tests completed? 10-02-23 ECHO EF 55%  Results viewable: [facility] Media Tab  Any Recent Hospitalizations? No  Height 6' 2 (1.88 m)  Weight 107.3 kg  Pat Appointment Scheduled (S)  Yes (BMP)

## 2023-11-23 ENCOUNTER — Other Ambulatory Visit (HOSPITAL_COMMUNITY): Payer: Self-pay | Admitting: Internal Medicine

## 2023-11-23 ENCOUNTER — Encounter (HOSPITAL_BASED_OUTPATIENT_CLINIC_OR_DEPARTMENT_OTHER)
Admission: RE | Admit: 2023-11-23 | Discharge: 2023-11-23 | Disposition: A | Discharge status: Home / Self Care | Source: Ambulatory Visit | Attending: Orthopedic Surgery | Admitting: Orthopedic Surgery

## 2023-11-23 DIAGNOSIS — N6320 Unspecified lump in the left breast, unspecified quadrant: Secondary | ICD-10-CM

## 2023-11-23 DIAGNOSIS — I10 Essential (primary) hypertension: Secondary | ICD-10-CM | POA: Diagnosis not present

## 2023-11-23 DIAGNOSIS — M65331 Trigger finger, right middle finger: Secondary | ICD-10-CM | POA: Diagnosis present

## 2023-11-23 DIAGNOSIS — E669 Obesity, unspecified: Secondary | ICD-10-CM | POA: Diagnosis not present

## 2023-11-23 DIAGNOSIS — Z6830 Body mass index (BMI) 30.0-30.9, adult: Secondary | ICD-10-CM | POA: Diagnosis not present

## 2023-11-23 DIAGNOSIS — K219 Gastro-esophageal reflux disease without esophagitis: Secondary | ICD-10-CM | POA: Diagnosis not present

## 2023-11-23 DIAGNOSIS — Z87891 Personal history of nicotine dependence: Secondary | ICD-10-CM | POA: Diagnosis not present

## 2023-11-23 LAB — BASIC METABOLIC PANEL WITH GFR
Anion gap: 9 (ref 5–15)
BUN: 13 mg/dL (ref 8–23)
CO2: 26 mmol/L (ref 22–32)
Calcium: 9.3 mg/dL (ref 8.9–10.3)
Chloride: 100 mmol/L (ref 98–111)
Creatinine, Ser: 0.88 mg/dL (ref 0.61–1.24)
GFR, Estimated: >60 mL/min (ref >60)
Glucose, Bld: 84 mg/dL (ref 70–99)
Potassium: 3.9 mmol/L (ref 3.5–5.1)
Sodium: 135 mmol/L (ref 135–145)

## 2023-11-23 NOTE — Progress Notes (Signed)

## 2023-11-23 NOTE — Anesthesia Preprocedure Evaluation (Signed)
 Anesthesia Evaluation  Patient identified by MRN, date of birth, ID band Patient awake    Reviewed: Allergy & Precautions, NPO status , Patient's Chart, lab work & pertinent test results  Airway Mallampati: II  TM Distance: >3 FB Neck ROM: Full    Dental  (+) Dental Advisory Given, Edentulous Upper, Edentulous Lower   Pulmonary former smoker Quit smoking 2012 Snores at night, no sleep study     Pulmonary exam normal breath sounds clear to auscultation       Cardiovascular hypertension (138/106 preop,), Pt. on medications Normal cardiovascular exam+ dysrhythmias (xarelto  LD 10/25) Atrial Fibrillation  Rhythm:Regular Rate:Normal     Neuro/Psych negative neurological ROS  negative psych ROS   GI/Hepatic ,GERD  Controlled and Medicated,,(+)       alcohol use12 drinks/wk   Endo/Other  Obesity BMI 30  Renal/GU negative Renal ROS  negative genitourinary   Musculoskeletal negative musculoskeletal ROS (+)    Abdominal  (+) + obese  Peds  Hematology negative hematology ROS (+)   Anesthesia Other Findings   Reproductive/Obstetrics negative OB ROS                              Anesthesia Physical Anesthesia Plan  ASA: 2  Anesthesia Plan: MAC   Post-op Pain Management: Tylenol  PO (pre-op)*   Induction:   PONV Risk Score and Plan: 2 and Propofol  infusion and TIVA  Airway Management Planned: Natural Airway and Simple Face Mask  Additional Equipment: None  Intra-op Plan:   Post-operative Plan:   Informed Consent: I have reviewed the patients History and Physical, chart, labs and discussed the procedure including the risks, benefits and alternatives for the proposed anesthesia with the patient or authorized representative who has indicated his/her understanding and acceptance.       Plan Discussed with: CRNA  Anesthesia Plan Comments:          Anesthesia Quick  Evaluation

## 2023-11-24 ENCOUNTER — Encounter (HOSPITAL_BASED_OUTPATIENT_CLINIC_OR_DEPARTMENT_OTHER): Payer: Self-pay | Admitting: Anesthesiology

## 2023-11-24 ENCOUNTER — Ambulatory Visit (HOSPITAL_BASED_OUTPATIENT_CLINIC_OR_DEPARTMENT_OTHER)
Admission: RE | Admit: 2023-11-24 | Discharge: 2023-11-24 | Disposition: A | Discharge status: Home / Self Care | Attending: Orthopedic Surgery | Admitting: Orthopedic Surgery

## 2023-11-24 ENCOUNTER — Other Ambulatory Visit: Payer: Self-pay

## 2023-11-24 ENCOUNTER — Encounter (HOSPITAL_BASED_OUTPATIENT_CLINIC_OR_DEPARTMENT_OTHER): Payer: Self-pay | Admitting: Orthopedic Surgery

## 2023-11-24 ENCOUNTER — Ambulatory Visit (HOSPITAL_BASED_OUTPATIENT_CLINIC_OR_DEPARTMENT_OTHER): Payer: Self-pay | Admitting: Anesthesiology

## 2023-11-24 ENCOUNTER — Encounter (HOSPITAL_BASED_OUTPATIENT_CLINIC_OR_DEPARTMENT_OTHER)
Admission: RE | Disposition: A | Discharge status: Home / Self Care | Payer: Self-pay | Source: Home / Self Care | Attending: Orthopedic Surgery

## 2023-11-24 DIAGNOSIS — Z87891 Personal history of nicotine dependence: Secondary | ICD-10-CM | POA: Insufficient documentation

## 2023-11-24 DIAGNOSIS — M65331 Trigger finger, right middle finger: Secondary | ICD-10-CM

## 2023-11-24 DIAGNOSIS — I10 Essential (primary) hypertension: Secondary | ICD-10-CM | POA: Diagnosis not present

## 2023-11-24 DIAGNOSIS — Z6830 Body mass index (BMI) 30.0-30.9, adult: Secondary | ICD-10-CM | POA: Insufficient documentation

## 2023-11-24 DIAGNOSIS — E669 Obesity, unspecified: Secondary | ICD-10-CM | POA: Insufficient documentation

## 2023-11-24 DIAGNOSIS — K219 Gastro-esophageal reflux disease without esophagitis: Secondary | ICD-10-CM | POA: Insufficient documentation

## 2023-11-24 HISTORY — DX: Unspecified atrial fibrillation: I48.91

## 2023-11-24 HISTORY — PX: TRIGGER FINGER RELEASE: SHX641

## 2023-11-24 LAB — GLUCOSE, CAPILLARY: Glucose-Capillary: 89 mg/dL (ref 70–99)

## 2023-11-24 SURGERY — RELEASE, A1 PULLEY, FOR TRIGGER FINGER
Anesthesia: Monitor Anesthesia Care | Site: Hand | Laterality: Right

## 2023-11-24 MED ORDER — BUPIVACAINE HCL (PF) 0.25 % IJ SOLN
INTRAMUSCULAR | Status: DC | PRN
  Administered 2023-11-24: 8 mL

## 2023-11-24 MED ORDER — BUPIVACAINE HCL (PF) 0.25 % IJ SOLN
INTRAMUSCULAR | Status: AC
  Filled 2023-11-24: qty 30

## 2023-11-24 MED ORDER — CEFAZOLIN SODIUM-DEXTROSE 2-4 GM/100ML-% IV SOLN
2.0000 g | INTRAVENOUS | Status: AC
  Administered 2023-11-24: 2 g via INTRAVENOUS

## 2023-11-24 MED ORDER — ACETAMINOPHEN 500 MG PO TABS
ORAL_TABLET | ORAL | Status: AC
  Filled 2023-11-24: qty 2

## 2023-11-24 MED ORDER — CEFAZOLIN SODIUM-DEXTROSE 2-4 GM/100ML-% IV SOLN
INTRAVENOUS | Status: AC
  Filled 2023-11-24: qty 100

## 2023-11-24 MED ORDER — PROPOFOL 10 MG/ML IV BOLUS
INTRAVENOUS | Status: DC | PRN
  Administered 2023-11-24: 20 mg via INTRAVENOUS

## 2023-11-24 MED ORDER — ONDANSETRON 4 MG PO TBDP: 4.0000 mg | ORAL_TABLET | Freq: Three times a day (TID) | ORAL | 0 refills | Status: AC | PRN

## 2023-11-24 MED ORDER — LACTATED RINGERS IV SOLN: INTRAVENOUS | Status: DC

## 2023-11-24 MED ORDER — FENTANYL CITRATE (PF) 100 MCG/2ML IJ SOLN
INTRAMUSCULAR | Status: AC
  Filled 2023-11-24: qty 2

## 2023-11-24 MED ORDER — TRAMADOL HCL 50 MG PO TABS: 50.0000 mg | ORAL_TABLET | Freq: Three times a day (TID) | ORAL | 0 refills | Status: AC | PRN

## 2023-11-24 MED ORDER — PHENYLEPHRINE HCL (PRESSORS) 10 MG/ML IV SOLN
INTRAVENOUS | Status: DC | PRN
Start: 2023-11-24 — End: 2023-11-24
  Administered 2023-11-24: 80 ug via INTRAVENOUS

## 2023-11-24 MED ORDER — POVIDONE-IODINE 10 % EX SWAB
2.0000 | Freq: Once | CUTANEOUS | Status: AC
  Administered 2023-11-24: 2 via TOPICAL

## 2023-11-24 MED ORDER — LIDOCAINE 2% (20 MG/ML) 5 ML SYRINGE
INTRAMUSCULAR | Status: AC
  Filled 2023-11-24: qty 5

## 2023-11-24 MED ORDER — DEXAMETHASONE SODIUM PHOSPHATE 4 MG/ML IJ SOLN
INTRAMUSCULAR | Status: DC | PRN
Start: 2023-11-24 — End: 2023-11-24
  Administered 2023-11-24: 5 mg via INTRAVENOUS

## 2023-11-24 MED ORDER — PROPOFOL 500 MG/50ML IV EMUL
INTRAVENOUS | Status: DC | PRN
  Administered 2023-11-24: 100 ug/kg/min via INTRAVENOUS

## 2023-11-24 MED ORDER — OXYCODONE HCL 5 MG PO TABS: 5.0000 mg | ORAL_TABLET | Freq: Once | ORAL | Status: DC | PRN

## 2023-11-24 MED ORDER — ONDANSETRON HCL 4 MG/2ML IJ SOLN
INTRAMUSCULAR | Status: AC
  Filled 2023-11-24: qty 2

## 2023-11-24 MED ORDER — FENTANYL CITRATE (PF) 100 MCG/2ML IJ SOLN
INTRAMUSCULAR | Status: DC | PRN
  Administered 2023-11-24: 50 ug via INTRAVENOUS

## 2023-11-24 MED ORDER — AMISULPRIDE (ANTIEMETIC) 5 MG/2ML IV SOLN: 10.0000 mg | Freq: Once | INTRAVENOUS | Status: DC | PRN

## 2023-11-24 MED ORDER — ACETAMINOPHEN 500 MG PO TABS
1000.0000 mg | ORAL_TABLET | Freq: Once | ORAL | Status: AC
  Administered 2023-11-24: 1000 mg via ORAL

## 2023-11-24 MED ORDER — BUPIVACAINE HCL (PF) 0.5 % IJ SOLN
INTRAMUSCULAR | Status: AC
  Filled 2023-11-24: qty 30

## 2023-11-24 MED ORDER — FENTANYL CITRATE (PF) 100 MCG/2ML IJ SOLN: 25.0000 ug | INTRAMUSCULAR | Status: DC | PRN

## 2023-11-24 MED ORDER — LIDOCAINE HCL (CARDIAC) PF 100 MG/5ML IV SOSY
PREFILLED_SYRINGE | INTRAVENOUS | Status: DC | PRN
  Administered 2023-11-24: 50 mg via INTRAVENOUS

## 2023-11-24 MED ORDER — ACETAMINOPHEN 500 MG PO TABS: 1000.0000 mg | ORAL_TABLET | Freq: Four times a day (QID) | ORAL | 0 refills | Status: AC | PRN

## 2023-11-24 MED ORDER — ACETAMINOPHEN 500 MG PO TABS: 1000.0000 mg | ORAL_TABLET | Freq: Once | ORAL | Status: DC

## 2023-11-24 MED ORDER — PHENYLEPHRINE 80 MCG/ML (10ML) SYRINGE FOR IV PUSH (FOR BLOOD PRESSURE SUPPORT)
PREFILLED_SYRINGE | INTRAVENOUS | Status: AC
Start: 2023-11-24 — End: 2023-11-24
  Filled 2023-11-24: qty 10

## 2023-11-24 MED ORDER — ONDANSETRON HCL 4 MG/2ML IJ SOLN: 4.0000 mg | Freq: Once | INTRAMUSCULAR | Status: DC | PRN

## 2023-11-24 MED ORDER — 0.9 % SODIUM CHLORIDE (POUR BTL) OPTIME
TOPICAL | Status: DC | PRN
Start: 2023-11-24 — End: 2023-11-24
  Administered 2023-11-24: 1000 mL

## 2023-11-24 MED ORDER — OXYCODONE HCL 5 MG/5ML PO SOLN: 5.0000 mg | Freq: Once | ORAL | Status: DC | PRN

## 2023-11-24 MED ORDER — PROPOFOL 500 MG/50ML IV EMUL
INTRAVENOUS | Status: AC
  Filled 2023-11-24: qty 50

## 2023-11-24 MED ORDER — ONDANSETRON HCL 4 MG/2ML IJ SOLN
INTRAMUSCULAR | Status: DC | PRN
  Administered 2023-11-24: 4 mg via INTRAVENOUS

## 2023-11-24 SURGICAL SUPPLY — 33 items
BLADE SURG 11 STRL SS (BLADE) IMPLANT
BLADE SURG 15 STRL LF DISP TIS (BLADE) ×1 IMPLANT
BNDG COHESIVE 3X5 TAN ST LF (GAUZE/BANDAGES/DRESSINGS) IMPLANT
BNDG COMPR ESMARK 4X3 LF (GAUZE/BANDAGES/DRESSINGS) IMPLANT
BNDG ELASTIC 3INX 5YD STR LF (GAUZE/BANDAGES/DRESSINGS) ×1 IMPLANT
BNDG GAUZE DERMACEA FLUFF 4 (GAUZE/BANDAGES/DRESSINGS) ×1 IMPLANT
CHLORAPREP W/TINT 26 (MISCELLANEOUS) ×1 IMPLANT
CORD BIPOLAR FORCEPS 12FT (ELECTRODE) IMPLANT
COVER BACK TABLE 60X90IN (DRAPES) ×1 IMPLANT
CUFF TOURN SGL QUICK 18X4 (TOURNIQUET CUFF) ×1 IMPLANT
DRAPE EXTREMITY T 121X128X90 (DISPOSABLE) ×1 IMPLANT
DRAPE IMP U-DRAPE 54X76 (DRAPES) ×1 IMPLANT
DRAPE SURG 17X23 STRL (DRAPES) ×1 IMPLANT
DRSG EMULSION OIL 3X3 NADH (GAUZE/BANDAGES/DRESSINGS) ×1 IMPLANT
GAUZE PAD ABD 8X10 STRL (GAUZE/BANDAGES/DRESSINGS) IMPLANT
GAUZE SPONGE 4X4 12PLY STRL (GAUZE/BANDAGES/DRESSINGS) ×1 IMPLANT
GLOVE BIO SURGEON STRL SZ7.5 (GLOVE) ×1 IMPLANT
GLOVE BIOGEL PI IND STRL 7.0 (GLOVE) ×1 IMPLANT
GLOVE BIOGEL PI IND STRL 8 (GLOVE) ×1 IMPLANT
GLOVE SURG SYN 7.0 PF PI (GLOVE) ×1 IMPLANT
GOWN STRL REUS W/ TWL LRG LVL3 (GOWN DISPOSABLE) ×2 IMPLANT
GOWN STRL REUS W/ TWL XL LVL3 (GOWN DISPOSABLE) ×1 IMPLANT
NDL HYPO 25X1 1.5 SAFETY (NEEDLE) IMPLANT
NEEDLE HYPO 25X1 1.5 SAFETY (NEEDLE) IMPLANT
PACK BASIN DAY SURGERY FS (CUSTOM PROCEDURE TRAY) ×1 IMPLANT
PAD CAST 3X4 CTTN HI CHSV (CAST SUPPLIES) IMPLANT
PADDING CAST ABS COTTON 3X4 (CAST SUPPLIES) IMPLANT
PADDING CAST ABS COTTON 4X4 ST (CAST SUPPLIES) ×1 IMPLANT
SUT ETHILON 3 0 PS 1 (SUTURE) ×1 IMPLANT
SYR BULB EAR ULCER 3OZ GRN STR (SYRINGE) ×1 IMPLANT
SYR CONTROL 10ML LL (SYRINGE) IMPLANT
TOWEL GREEN STERILE FF (TOWEL DISPOSABLE) ×1 IMPLANT
UNDERPAD 30X36 HEAVY ABSORB (UNDERPADS AND DIAPERS) ×1 IMPLANT

## 2023-11-24 NOTE — Anesthesia Postprocedure Evaluation (Signed)
 Anesthesia Post Note  Patient: Trevor Rangel  Procedure(s) Performed: RELEASE, A1 PULLEY, FOR TRIGGER FINGER (Right: Hand)     Patient location during evaluation: Phase II Anesthesia Type: MAC Level of consciousness: awake and alert Pain management: pain level controlled Vital Signs Assessment: post-procedure vital signs reviewed and stable Respiratory status: spontaneous breathing, nonlabored ventilation and respiratory function stable Cardiovascular status: blood pressure returned to baseline and stable Postop Assessment: no apparent nausea or vomiting Anesthetic complications: no   No notable events documented.  Last Vitals:  Vitals:   11/24/23 1031 11/24/23 1045  BP: 130/81 (!) 122/99  Pulse: 68 60  Resp: 16 16  Temp: (!) 36.4 C   SpO2: 95% 95%    Last Pain:  Vitals:   11/24/23 1045  TempSrc:   PainSc: 0-No pain                 Almarie CHRISTELLA Marchi

## 2023-11-24 NOTE — Op Note (Signed)
 11/24/2023  10:49 AM  PATIENT:  Trevor Rangel    PRE-OPERATIVE DIAGNOSIS:  TRIGGER FINGER, RIGHT LONG  POST-OPERATIVE DIAGNOSIS:  Same  PROCEDURE:  RELEASE, A1 PULLEY, FOR TRIGGER FINGER  SURGEON:  Evalene JONETTA Chancy, MD  ASSISTANT: Gerard Large, PA-C, he was present and scrubbed throughout the case, critical for completion in a timely fashion, and for retraction, instrumentation, and closure.   ANESTHESIA:   block mask  PREOPERATIVE INDICATIONS:  Trevor Rangel is a  73 y.o. male with a diagnosis of TRIGGER FINGER, RIGHT LONG who failed conservative measures and elected for surgical management.    The risks benefits and alternatives were discussed with the patient preoperatively including but not limited to the risks of infection, bleeding, nerve injury, cardiopulmonary complications, the need for revision surgery, among others, and the patient was willing to proceed.  OPERATIVE IMPLANTS: none  OPERATIVE FINDINGS: tight A1 pulley  BLOOD LOSS: min  COMPLICATIONS: none  TOURNIQUET TIME: none  OPERATIVE PROCEDURE:  Patient was identified in the preoperative holding area and site was marked by me He was transported to the operating theater and placed on the table in supine position taking care to pad all bony prominences. After a preincinduction time out anesthesia was induced. The right upper extremity was prepped and draped in normal sterile fashion and a pre-incision timeout was performed. He received ancef  for preoperative antibiotics.   I made a oblique incision over the A1 pulley and bluntly dissected to the pulley.  I protected the neurovascular structures and retracted these to the side as I used a scissor to incise the A1 pulley Activon firm complete release delivered the tendon there was no adhesions.  I thoroughly irrigated and closed the skin with a nylon stitch sterile dressing applied  POST OPERATIVE PLAN: Dressings for 3 days stitch removal in 10

## 2023-11-24 NOTE — Transfer of Care (Signed)
 Immediate Anesthesia Transfer of Care Note  Patient: Trevor Rangel  Procedure(s) Performed: RELEASE, A1 PULLEY, FOR TRIGGER FINGER (Right: Hand)  Patient Location: PACU  Anesthesia Type:MAC  Level of Consciousness: awake, alert , and oriented  Airway & Oxygen Therapy: Patient Spontanous Breathing and Patient connected to face mask oxygen  Post-op Assessment: Report given to RN and Post -op Vital signs reviewed and stable  Post vital signs: Reviewed and stable  Last Vitals:  Vitals Value Taken Time  BP 130/81 11/24/23 10:31  Temp 36.4 C 11/24/23 10:31  Pulse 68 11/24/23 10:32  Resp 15 11/24/23 10:32  SpO2 93 % 11/24/23 10:32  Vitals shown include unfiled device data.  Last Pain:  Vitals:   11/24/23 0745  TempSrc: Temporal  PainSc: 0-No pain      Patients Stated Pain Goal: 4 (11/24/23 0745)  Complications: No notable events documented.

## 2023-11-24 NOTE — Interval H&P Note (Signed)
 History and Physical Interval Note:  11/24/2023 9:28 AM  Trevor Rangel  has presented today for surgery, with the diagnosis of TRIGGER FINGER, RIGHT LONG.  The various methods of treatment have been discussed with the patient and family. After consideration of risks, benefits and other options for treatment, the patient has consented to  Procedure(s): RELEASE, A1 PULLEY, FOR TRIGGER FINGER (Right) as a surgical intervention.  The patient's history has been reviewed, patient examined, no change in status, stable for surgery.  I have reviewed the patient's chart and labs.  Questions were answered to the patient's satisfaction.     Trevor Rangel

## 2023-11-24 NOTE — H&P (Signed)
 PREOPERATIVE H&P   Chief Complaint: TRIGGER FINGER RIGHT LONG FINGER   HPI: Trevor Rangel is a 73 y.o. male who presents with a diagnosis of TRIGGER FINGER RIGHT LONG FINGER. Symptoms are rated as moderate to severe, and have been worsening.  This is significantly impairing activities of daily living.  He has elected for surgical management.        Past Medical History:  Diagnosis Date   Allergy     BPH (benign prostatic hyperplasia)     Cataract      surgery   DDD (degenerative disc disease), lumbar     GERD (gastroesophageal reflux disease)     Hyperlipidemia     Hypertension     Sciatica               Past Surgical History:  Procedure Laterality Date   COLONOSCOPY   ~2004    Normal per patient   ELBOW SURGERY Left 2019   INGUINAL HERNIA REPAIR Left 05/04/2009    Dr Lorriane   INGUINAL HERNIA REPAIR Bilateral 03/17/2013    Procedure: LAPAROSCOPIC EXPLORATION AND REPAIR OF BILATERAL INGUINAL HERNIAS;  Surgeon: Elspeth KYM Schultze, MD;  Location: [facility]S;  Service: General;  Laterality: Bilateral;   INSERTION OF MESH Bilateral 03/17/2013    Procedure: INSERTION OF MESH;  Surgeon: Elspeth KYM Schultze, MD;  Location: [facility];  Service: General;  Laterality: Bilateral;   KNEE SURGERY            Social History         Socioeconomic History   Marital status: Single      Spouse name: Not on file   Number of children: Not on file   Years of education: Not on file   Highest education level: Not on file  Occupational History   Not on file  Tobacco Use   Smoking status: Former      Current packs/day: 0.00      Types: Cigarettes      Quit date: 03/10/2010      Years since quitting: 13.4   Smokeless tobacco: Never  Vaping Use   Vaping status: Never Used  Substance and Sexual Activity   Alcohol use: Yes      Alcohol/week: 12.0 standard drinks of alcohol      Types: 12 Cans of beer per week   Drug use: No   Sexual activity: Yes  Other Topics Concern   Not on file  Social  History Narrative    Right handed     Lives with girl friend     Social Drivers of Manufacturing Engineer Strain: Not on file  Food Insecurity: Not on file  Transportation Needs: Not on file  Physical Activity: Not on file  Stress: Not on file  Social Connections: Not on file         Family History  Problem Relation Age of Onset   Heart disease Mother     Cancer Father          prostate   Colon cancer Neg Hx     Esophageal cancer Neg Hx     Rectal cancer Neg Hx     Stomach cancer Neg Hx          Allergies  No Known Allergies          Prior to Admission medications   Medication Sig Start Date End Date Taking? Authorizing Provider  amLODipine (NORVASC) 10 MG tablet Take 10 mg by  mouth daily. 05/10/13     [provider]  ASPIRIN 81 PO         [provider]  cetirizine (ZYRTEC) 10 MG tablet Take 10 mg by mouth daily. 09/24/20     [provider]  cloNIDine (CATAPRES) 0.1 MG tablet Take 0.1 mg by mouth daily.       [provider]  dorzolamide-timolol (COSOPT) 22.3-6.8 MG/ML ophthalmic solution SMARTSIG:In Eye(s) 01/02/20     [provider]  famotidine (PEPCID) 20 MG tablet Take 20 mg by mouth 2 (two) times daily.       [provider]  furosemide (LASIX) 20 MG tablet Take 20 mg by mouth daily.  05/25/13     [provider]  lisinopril-hydrochlorothiazide (ZESTORETIC) 20-12.5 MG tablet Take 1 tablet by mouth daily. 10/24/20     [provider]  meloxicam (MOBIC) 15 MG tablet   11/11/19     [provider]  Menthol-Methyl Salicylate (MUSCLE RUB) 10-15 % CREA Apply 1 application topically 3 (three) times daily as needed for muscle pain.       [provider]  methylcellulose (ARTIFICIAL TEARS) 1 % ophthalmic solution Place 1 drop into both eyes 3 (three) times daily as needed (dry eyes).       [provider]  ranitidine (ZANTAC) 150 MG tablet Take 150 mg by mouth  daily. Patient not taking: Reported on 03/28/2021       [provider]  simvastatin (ZOCOR) 20 MG tablet Take 20 mg by mouth at bedtime.  05/25/13     [provider]        Positive ROS: All other systems have been reviewed and were otherwise negative with the exception of those mentioned in the HPI and as above.   Physical Exam: General: Alert, no acute distress Cardiovascular: No pedal edema Respiratory: No cyanosis, no use of accessory musculature GI: No organomegaly, abdomen is soft and non-tender Skin: No lesions in the area of chief complaint Neurologic: Sensation intact distally Psychiatric: Patient is competent for consent with normal mood and affect Lymphatic: No axillary or cervical lymphadenopathy   MUSCULOSKELETAL: TTP right long finger, nodule at A1 pulley in palmar surface, limited ROM long finger, triggering seen, no edema or erythema, NVI     Imaging: n/a     Assessment: TRIGGER FINGER RIGHT LONG FINGER   Plan: Plan for Procedure(s): RELEASE, A1 PULLEY, FOR TRIGGER FINGER   The risks benefits and alternatives were discussed with the patient including but not limited to the risks of nonoperative treatment, versus surgical intervention including infection, bleeding, nerve injury,  blood clots, cardiopulmonary complications, morbidity, mortality, among others, and they were willing to proceed.    Weightbearing: WBAT RUE Orthopedic devices: n/a Showering: POD 3 Dressing: reinforce PRN Medicines: Ultram, Tylenol , Zofran 

## 2023-11-24 NOTE — Discharge Instructions (Addendum)
 POST-OPERATIVE OPIOID TAPER INSTRUCTIONS: It is important to wean off of your opioid medication as soon as possible. If you do not need pain medication after your surgery it is ok to stop day one. Opioids include: Codeine, Hydrocodone (Norco, Vicodin), Oxycodone (Percocet, oxycontin ) and hydromorphone  amongst others.  Long term and even short term use of opiods can cause: Increased pain response Dependence Constipation Depression Respiratory depression And more.  Withdrawal symptoms can include Flu like symptoms Nausea, vomiting And more Techniques to manage these symptoms Hydrate well Eat regular healthy meals Stay active Use relaxation techniques(deep breathing, meditating, yoga) Do Not substitute Alcohol to help with tapering If you have been on opioids for less than two weeks and do not have pain than it is ok to stop all together.  Plan to wean off of opioids This plan should start within one week post op of your joint replacement. Maintain the same interval or time between taking each dose and first decrease the dose.  Cut the total daily intake of opioids by one tablet each day Next start to increase the time between doses. The last dose that should be eliminated is the evening dose.     Post Anesthesia Home Care Instructions  Activity: Get plenty of rest for the remainder of the day. A responsible individual must stay with you for 24 hours following the procedure.  For the next 24 hours, DO NOT: -Drive a car -Advertising copywriter -Drink alcoholic beverages -Take any medication unless instructed by your physician -Make any legal decisions or sign important papers.  Meals: Start with liquid foods such as gelatin or soup. Progress to regular foods as tolerated. Avoid greasy, spicy, heavy foods. If nausea and/or vomiting occur, drink only clear liquids until the nausea and/or vomiting subsides. Call your physician if vomiting continues.  Special Instructions/Symptoms: Your  throat may feel dry or sore from the anesthesia or the breathing tube placed in your throat during surgery. If this causes discomfort, gargle with warm salt water. The discomfort should disappear within 24 hours.  If you had a scopolamine patch placed behind your ear for the management of post- operative nausea and/or vomiting:  1. The medication in the patch is effective for 72 hours, after which it should be removed.  Wrap patch in a tissue and discard in the trash. Wash hands thoroughly with soap and water. 2. You may remove the patch earlier than 72 hours if you experience unpleasant side effects which may include dry mouth, dizziness or visual disturbances. 3. Avoid touching the patch. Wash your hands with soap and water after contact with the patch.      Next dose of Tylenol  may be given at 1:45pm if needed.

## 2023-11-24 NOTE — Anesthesia Procedure Notes (Signed)
 Procedure Name: MAC Date/Time: 11/24/2023 9:54 AM  Performed by: Buster Catheryn SAUNDERS, CRNAPre-anesthesia Checklist: Patient identified, Emergency Drugs available, Suction available, Patient being monitored and Timeout performed Patient Re-evaluated:Patient Re-evaluated prior to induction Oxygen Delivery Method: Simple face mask Placement Confirmation: positive ETCO2

## 2023-11-25 ENCOUNTER — Encounter (HOSPITAL_BASED_OUTPATIENT_CLINIC_OR_DEPARTMENT_OTHER): Payer: Self-pay | Admitting: Orthopedic Surgery

## 2023-11-28 NOTE — Progress Notes (Deleted)
 Cardiology Office Note:    Date:  11/28/2023   ID:  Trevor Rangel, Trevor Rangel April 12, 1950, MRN [id number]  PCP:  Rexanne Ingle, MD   [facility] Providers Cardiologist:  None { Click to update primary MD,subspecialty MD or APP then REFRESH:1}    Referring MD: Rexanne Ingle, MD   Chief complaint: Follow up of PAF and LVH     History of Present Illness:   Trevor Rangel is a 73 y.o. male with a hx of BPH, cataracts, HTN, HLD, GERD, ascending aortic aneurysm (4.2cm by CT 10/2022), former tobacco use, neuropathy presenting to the office for follow-up of paroxysmal atrial fibrillation and recent trigger finger release surgery.  The patient has no formal cardiac hx though had prior CT 10/2022 showing 4.2cm ascending TAA and cardiomegaly. He recently presented to ortho for trigger finger surgery and found to be in rate controlled atrial fibrillation. Surgery was cancelled and preoperative evaluation requested. He saw his PCP and Eliquis was started. ASA and meloxicam were discontinued. Prescribing date was 09/01/23 per discussion with pharmacy. Echo 10/02/23 showed EF 55% but inferior basal hypokinesis, moderate asymmetric LVH of basal/septal segments, mildly reduced RV function, mildly elevated PASP, moderate BAE, mild MR, moderate TR, moderate aortic dilation of root/ascending aorta, dilated IVC.   Last seen in the office by Lonell Bring on 10/15/2023.  Was doing well at that time, denied cardiac awareness, remained in atrial fibrillation, rate controlled.  Reported he was often only taking his Eliquis once per day due to difficulties with remembering to take his second dose, was switched to Xarelto  once daily at this visit.  Sleep study was ordered for suspected sleep apnea.  Cardiac MRI was ordered to pursue further evaluation of infiltrative cardiomyopathy secondary to evidence of LVH and questionable inferior wall motion abnormality on echocardiogram.  Cardiac MRI 11/04/2023: Normal LV chamber, size,  function, with severe LVH of the basal-mid septum, no RWMA, LVEF 53%, no delayed myocardial enhancement, extracellular volume 33%, suggest nonspecific elevation, findings in combination are not suggestive of cardiac amyloidosis.    ROS:   Please see the history of present illness.    *** All other systems reviewed and are negative.     Past Medical History:  Diagnosis Date   A-fib [facility])    on xarelto    Allergy    BPH (benign prostatic hyperplasia)    Cataract    surgery   DDD (degenerative disc disease), lumbar    GERD (gastroesophageal reflux disease)    Hyperlipidemia    Hypertension    Sciatica     Past Surgical History:  Procedure Laterality Date   COLONOSCOPY  ~2004   Normal per patient   ELBOW SURGERY Left 2019   INGUINAL HERNIA REPAIR Left 05/04/2009   Dr Lorriane   INGUINAL HERNIA REPAIR Bilateral 03/17/2013   Procedure: LAPAROSCOPIC EXPLORATION AND REPAIR OF BILATERAL INGUINAL HERNIAS;  Surgeon: Elspeth KYM Schultze, MD;  Location: [facility];  Service: General;  Laterality: Bilateral;   INSERTION OF MESH Bilateral 03/17/2013   Procedure: INSERTION OF MESH;  Surgeon: Elspeth KYM Schultze, MD;  Location: [facility];  Service: General;  Laterality: Bilateral;   KNEE SURGERY     TRIGGER FINGER RELEASE Right 11/24/2023   Procedure: RELEASE, A1 PULLEY, FOR TRIGGER FINGER;  Surgeon: Beverley Evalene BIRCH, MD;  Location: [facility];  Service: Orthopedics;  Laterality: Right;    Current Medications: No outpatient medications have been marked as taking for the 11/30/23 encounter (Appointment) with Bring,  Dayna N, PA-C.     Allergies:   Patient has no known allergies.   Social History   Socioeconomic History   Marital status: Single    Spouse name: Not on file   Number of children: Not on file   Years of education: Not on file   Highest education level: Not on file  Occupational History   Not on file  Tobacco Use   Smoking status: Former    Current packs/day: 0.00     Types: Cigarettes    Quit date: 03/10/2010    Years since quitting: 13.7   Smokeless tobacco: Never  Vaping Use   Vaping status: Never Used  Substance and Sexual Activity   Alcohol use: Yes    Alcohol/week: 12.0 standard drinks of alcohol    Types: 12 Cans of beer per week   Drug use: No   Sexual activity: Yes  Other Topics Concern   Not on file  Social History Narrative   Right handed    Lives with girl friend    Social Drivers of Corporate Investment Banker Strain: Not on file  Food Insecurity: Not on file  Transportation Needs: Not on file  Physical Activity: Not on file  Stress: Not on file  Social Connections: Not on file     Family History: The patient's ***family history includes Cancer in his father; Heart disease in his mother. There is no history of Colon cancer, Esophageal cancer, Rectal cancer, or Stomach cancer.  EKGs/Labs/Other Studies Reviewed:    The following studies were reviewed today: ***      Recent Labs: 10/22/2023: Hemoglobin 13.3 11/23/2023: BUN 13; Creatinine, Ser 0.88; Potassium 3.9; Sodium 135  Recent Lipid Panel    Component Value Date/Time   CHOL 209 (H) 08/09/2007 2020   TRIG 62 08/09/2007 2020   HDL 82 08/09/2007 2020   CHOLHDL 2.5 Ratio 08/09/2007 2020   VLDL 12 08/09/2007 2020   LDLCALC 115 (H) 08/09/2007 2020     Risk Assessment/Calculations:   {Does this patient have ATRIAL FIBRILLATION?[phone]}  No BP recorded.  {Refresh Note OR Click here to enter BP  :1}***         Physical Exam:    VS:  There were no vitals taken for this visit.       Wt Readings from Last 3 Encounters:  11/24/23 236 lb 12.4 oz (107.4 kg)  10/15/23 236 lb 9.6 oz (107.3 kg)  03/28/21 218 lb 3.2 oz (99 kg)     GEN: *** Well nourished, well developed in no acute distress HEENT: Normal NECK: No JVD; No carotid bruits CARDIAC: *** S1-S2 normal, RRR, no murmurs, rubs, gallops RESPIRATORY:  Clear to auscultation without rales, wheezing or  rhonchi  MUSCULOSKELETAL:  No edema; No deformity  SKIN: Warm and dry NEUROLOGIC:  Alert and oriented x 3 PSYCHIATRIC:  Normal affect       Assessment & Plan PAF (paroxysmal atrial fibrillation) (HCC) EKG: Symptoms Resume Xarelto  on ***following surgery Denies excessive bleeding/bruising/rash Continue Xarelto  20 mg daily DCCV? Sleep-disordered breathing In-lab Sleep study ordered at prior visit on 10/15/2023 Abnormal echocardiogram Mild mitral regurgitation Moderate tricuspid regurgitation Left ventricular hypertrophy Echo 10/02/23 showed EF 55% but inferior basal hypokinesis, moderate asymmetric LVH of basal/septal segments, mildly reduced RV function, mildly elevated PASP, moderate BAE, mild MR, moderate TR, moderate aortic dilation of root/ascending aorta, dilated IVC. Cardiac MRI ordered at prior visit to rule out infiltrative cardiomyopathy Aneurysm of ascending aorta without rupture due for re-eval  10/2022, will see if dimension is outlined on cMRI.  Primary hypertension Managed by PCP Continue lisinopril-hydrochlorothiazide 20-12.5 daily        {Are you ordering a CV Procedure (e.g. stress test, cath, DCCV, TEE, etc)?   Press F2        :[phone]}    Medication Adjustments/Labs and Tests Ordered: Current medicines are reviewed at length with the patient today.  Concerns regarding medicines are outlined above.  No orders of the defined types were placed in this encounter.  No orders of the defined types were placed in this encounter.   There are no Patient Instructions on file for this visit.   Signed, Miriam FORBES Shams, NP  11/28/2023 8:17 PM    [facility]

## 2023-11-30 ENCOUNTER — Ambulatory Visit: Attending: Physician Assistant | Admitting: Physician Assistant

## 2023-11-30 DIAGNOSIS — I071 Rheumatic tricuspid insufficiency: Secondary | ICD-10-CM

## 2023-11-30 DIAGNOSIS — I340 Nonrheumatic mitral (valve) insufficiency: Secondary | ICD-10-CM

## 2023-11-30 DIAGNOSIS — I517 Cardiomegaly: Secondary | ICD-10-CM

## 2023-11-30 DIAGNOSIS — R931 Abnormal findings on diagnostic imaging of heart and coronary circulation: Secondary | ICD-10-CM

## 2023-11-30 DIAGNOSIS — G4730 Sleep apnea, unspecified: Secondary | ICD-10-CM

## 2023-11-30 DIAGNOSIS — I480 Paroxysmal atrial fibrillation: Secondary | ICD-10-CM

## 2023-11-30 DIAGNOSIS — I10 Essential (primary) hypertension: Secondary | ICD-10-CM

## 2023-11-30 DIAGNOSIS — I7121 Aneurysm of the ascending aorta, without rupture: Secondary | ICD-10-CM

## 2023-12-01 ENCOUNTER — Ambulatory Visit (HOSPITAL_COMMUNITY)
Admission: RE | Admit: 2023-12-01 | Discharge: 2023-12-01 | Disposition: A | Discharge status: Home / Self Care | Source: Ambulatory Visit | Attending: Internal Medicine | Admitting: Internal Medicine

## 2023-12-01 ENCOUNTER — Encounter (HOSPITAL_COMMUNITY): Payer: Self-pay

## 2023-12-01 DIAGNOSIS — N6320 Unspecified lump in the left breast, unspecified quadrant: Secondary | ICD-10-CM | POA: Diagnosis present

## 2023-12-01 DIAGNOSIS — N62 Hypertrophy of breast: Secondary | ICD-10-CM | POA: Diagnosis not present
